# Patient Record
Sex: Male | Born: 1935 | Race: White | Hispanic: No | Marital: Married | Smoking: Never smoker
Health system: Southern US, Community
[De-identification: ages and names within clinical notes are randomized; demographics above are authoritative.]

## PROBLEM LIST (undated history)

## (undated) DIAGNOSIS — M069 Rheumatoid arthritis, unspecified: Secondary | ICD-10-CM

## (undated) DIAGNOSIS — I219 Acute myocardial infarction, unspecified: Secondary | ICD-10-CM

## (undated) DIAGNOSIS — I10 Essential (primary) hypertension: Secondary | ICD-10-CM

## (undated) DIAGNOSIS — E785 Hyperlipidemia, unspecified: Secondary | ICD-10-CM

## (undated) DIAGNOSIS — H6060 Unspecified chronic otitis externa, unspecified ear: Secondary | ICD-10-CM

## (undated) DIAGNOSIS — H9190 Unspecified hearing loss, unspecified ear: Secondary | ICD-10-CM

## (undated) DIAGNOSIS — K219 Gastro-esophageal reflux disease without esophagitis: Secondary | ICD-10-CM

## (undated) DIAGNOSIS — I050 Rheumatic mitral stenosis: Secondary | ICD-10-CM

## (undated) DIAGNOSIS — R51 Headache: Secondary | ICD-10-CM

## (undated) DIAGNOSIS — N400 Enlarged prostate without lower urinary tract symptoms: Secondary | ICD-10-CM

## (undated) DIAGNOSIS — J309 Allergic rhinitis, unspecified: Secondary | ICD-10-CM

## (undated) DIAGNOSIS — Z974 Presence of external hearing-aid: Secondary | ICD-10-CM

## (undated) DIAGNOSIS — H905 Unspecified sensorineural hearing loss: Secondary | ICD-10-CM

## (undated) DIAGNOSIS — M4850XA Collapsed vertebra, not elsewhere classified, site unspecified, initial encounter for fracture: Secondary | ICD-10-CM

## (undated) DIAGNOSIS — I2510 Atherosclerotic heart disease of native coronary artery without angina pectoris: Secondary | ICD-10-CM

## (undated) DIAGNOSIS — R519 Headache, unspecified: Secondary | ICD-10-CM

## (undated) DIAGNOSIS — I4892 Unspecified atrial flutter: Secondary | ICD-10-CM

## (undated) DIAGNOSIS — C61 Malignant neoplasm of prostate: Secondary | ICD-10-CM

## (undated) DIAGNOSIS — A499 Bacterial infection, unspecified: Secondary | ICD-10-CM

## (undated) DIAGNOSIS — I350 Nonrheumatic aortic (valve) stenosis: Secondary | ICD-10-CM

## (undated) DIAGNOSIS — N390 Urinary tract infection, site not specified: Secondary | ICD-10-CM

## (undated) HISTORY — DX: Bacterial infection, unspecified: A49.9

## (undated) HISTORY — PX: CORONARY ARTERY BYPASS GRAFT: SHX141

## (undated) HISTORY — DX: Urinary tract infection, site not specified: N39.0

## (undated) HISTORY — DX: Allergic rhinitis, unspecified: J30.9

## (undated) HISTORY — DX: Collapsed vertebra, not elsewhere classified, site unspecified, initial encounter for fracture: M48.50XA

## (undated) HISTORY — DX: Nonrheumatic aortic (valve) stenosis: I35.0

## (undated) HISTORY — PX: CARDIAC CATHETERIZATION: SHX172

## (undated) HISTORY — DX: Rheumatic mitral stenosis: I05.0

## (undated) HISTORY — DX: Unspecified chronic otitis externa, unspecified ear: H60.60

## (undated) HISTORY — DX: Benign prostatic hyperplasia without lower urinary tract symptoms: N40.0

## (undated) HISTORY — DX: Rheumatoid arthritis, unspecified: M06.9

## (undated) HISTORY — DX: Unspecified sensorineural hearing loss: H90.5

## (undated) HISTORY — PX: PROSTATE SURGERY: SHX751

## (undated) HISTORY — DX: Atherosclerotic heart disease of native coronary artery without angina pectoris: I25.10

## (undated) HISTORY — DX: Hyperlipidemia, unspecified: E78.5

## (undated) HISTORY — DX: Essential (primary) hypertension: I10

## (undated) HISTORY — DX: Unspecified atrial flutter: I48.92

---

## 1989-07-08 HISTORY — PX: CARDIAC SURGERY: SHX584

## 2004-09-25 ENCOUNTER — Ambulatory Visit: Payer: Self-pay | Admitting: Urology

## 2004-10-03 ENCOUNTER — Ambulatory Visit: Payer: Self-pay | Admitting: Urology

## 2006-04-17 ENCOUNTER — Ambulatory Visit: Payer: Self-pay | Admitting: Ophthalmology

## 2006-04-23 ENCOUNTER — Ambulatory Visit: Payer: Self-pay | Admitting: Ophthalmology

## 2006-06-10 ENCOUNTER — Ambulatory Visit: Payer: Self-pay | Admitting: Specialist

## 2006-06-10 ENCOUNTER — Other Ambulatory Visit: Payer: Self-pay

## 2006-06-13 ENCOUNTER — Ambulatory Visit: Payer: Self-pay | Admitting: Specialist

## 2006-12-09 ENCOUNTER — Ambulatory Visit: Payer: Self-pay | Admitting: Ophthalmology

## 2008-01-20 ENCOUNTER — Ambulatory Visit: Payer: Self-pay | Admitting: Urology

## 2008-11-11 ENCOUNTER — Ambulatory Visit: Payer: Self-pay | Admitting: Urology

## 2009-06-27 ENCOUNTER — Ambulatory Visit: Payer: Self-pay | Admitting: Urology

## 2009-07-04 ENCOUNTER — Ambulatory Visit: Payer: Self-pay | Admitting: Urology

## 2010-07-08 HISTORY — PX: A FLUTTER ABLATION: SHX5348

## 2011-11-05 DIAGNOSIS — E785 Hyperlipidemia, unspecified: Secondary | ICD-10-CM

## 2011-11-05 DIAGNOSIS — M4850XA Collapsed vertebra, not elsewhere classified, site unspecified, initial encounter for fracture: Secondary | ICD-10-CM | POA: Insufficient documentation

## 2011-11-05 DIAGNOSIS — N400 Enlarged prostate without lower urinary tract symptoms: Secondary | ICD-10-CM

## 2011-11-05 DIAGNOSIS — M069 Rheumatoid arthritis, unspecified: Secondary | ICD-10-CM

## 2011-11-05 DIAGNOSIS — I10 Essential (primary) hypertension: Secondary | ICD-10-CM | POA: Insufficient documentation

## 2011-11-05 DIAGNOSIS — N390 Urinary tract infection, site not specified: Secondary | ICD-10-CM

## 2011-11-05 DIAGNOSIS — H6060 Unspecified chronic otitis externa, unspecified ear: Secondary | ICD-10-CM

## 2011-11-05 DIAGNOSIS — I350 Nonrheumatic aortic (valve) stenosis: Secondary | ICD-10-CM

## 2011-11-05 DIAGNOSIS — A499 Bacterial infection, unspecified: Secondary | ICD-10-CM

## 2011-11-05 HISTORY — DX: Nonrheumatic aortic (valve) stenosis: I35.0

## 2011-11-05 HISTORY — DX: Rheumatoid arthritis, unspecified: M06.9

## 2011-11-05 HISTORY — DX: Collapsed vertebra, not elsewhere classified, site unspecified, initial encounter for fracture: M48.50XA

## 2011-11-05 HISTORY — DX: Unspecified chronic otitis externa, unspecified ear: H60.60

## 2011-11-05 HISTORY — DX: Hyperlipidemia, unspecified: E78.5

## 2011-11-05 HISTORY — DX: Urinary tract infection, site not specified: N39.0

## 2011-11-05 HISTORY — DX: Benign prostatic hyperplasia without lower urinary tract symptoms: N40.0

## 2011-11-05 HISTORY — DX: Essential (primary) hypertension: I10

## 2011-11-05 HISTORY — DX: Urinary tract infection, site not specified: A49.9

## 2012-12-30 DIAGNOSIS — H905 Unspecified sensorineural hearing loss: Secondary | ICD-10-CM

## 2012-12-30 HISTORY — DX: Unspecified sensorineural hearing loss: H90.5

## 2013-04-16 ENCOUNTER — Ambulatory Visit: Payer: Self-pay | Admitting: Internal Medicine

## 2013-04-16 DIAGNOSIS — I4892 Unspecified atrial flutter: Secondary | ICD-10-CM

## 2013-04-16 HISTORY — DX: Unspecified atrial flutter: I48.92

## 2013-10-20 ENCOUNTER — Ambulatory Visit: Payer: Self-pay | Admitting: Ophthalmology

## 2013-12-28 DIAGNOSIS — I050 Rheumatic mitral stenosis: Secondary | ICD-10-CM

## 2013-12-28 HISTORY — DX: Rheumatic mitral stenosis: I05.0

## 2014-08-04 DIAGNOSIS — I2510 Atherosclerotic heart disease of native coronary artery without angina pectoris: Secondary | ICD-10-CM

## 2014-08-04 DIAGNOSIS — J309 Allergic rhinitis, unspecified: Secondary | ICD-10-CM | POA: Insufficient documentation

## 2014-08-04 HISTORY — DX: Allergic rhinitis, unspecified: J30.9

## 2014-08-04 HISTORY — DX: Atherosclerotic heart disease of native coronary artery without angina pectoris: I25.10

## 2014-09-07 ENCOUNTER — Encounter
Admit: 2014-09-07 | Disposition: A | Discharge status: Home / Self Care | Payer: Self-pay | Attending: Rheumatology | Admitting: Rheumatology

## 2014-10-07 ENCOUNTER — Encounter
Admit: 2014-10-07 | Disposition: A | Discharge status: Home / Self Care | Payer: Self-pay | Attending: Rheumatology | Admitting: Rheumatology

## 2015-04-04 ENCOUNTER — Encounter: Payer: Self-pay | Admitting: *Deleted

## 2015-04-06 ENCOUNTER — Ambulatory Visit (INDEPENDENT_AMBULATORY_CARE_PROVIDER_SITE_OTHER): Payer: Medicare Other | Admitting: Obstetrics and Gynecology

## 2015-04-06 ENCOUNTER — Telehealth: Payer: Self-pay | Admitting: Obstetrics and Gynecology

## 2015-04-06 ENCOUNTER — Encounter: Payer: Self-pay | Admitting: Obstetrics and Gynecology

## 2015-04-06 VITALS — BP 136/55 | HR 59 | Resp 16 | Ht 66.0 in | Wt 177.2 lb

## 2015-04-06 DIAGNOSIS — C61 Malignant neoplasm of prostate: Secondary | ICD-10-CM | POA: Diagnosis not present

## 2015-04-06 DIAGNOSIS — R339 Retention of urine, unspecified: Secondary | ICD-10-CM

## 2015-04-06 LAB — BLADDER SCAN AMB NON-IMAGING

## 2015-04-06 MED ORDER — FINASTERIDE 5 MG PO TABS: 5.0000 mg | ORAL_TABLET | Freq: Every day | ORAL | Status: DC

## 2015-04-06 MED ORDER — TAMSULOSIN HCL 0.4 MG PO CAPS: 0.4000 mg | ORAL_CAPSULE | Freq: Every day | ORAL | Status: DC

## 2015-04-06 NOTE — Telephone Encounter (Signed)
Spoke with pt in reference to medication. Medication was sent to pharmacy and pt made aware. Nurse also made pt aware PSA will the drawn in 2 weeks when he returns for a urine drop off. Pt voiced understanding.

## 2015-04-06 NOTE — Telephone Encounter (Signed)
The patient left prior to having his PSA drawn today. He has an appointment in 2 weeks to drop off a urine for culture please notify him that he needs to have his PSA drawn on that day. I put in the order and I changed it on the lab appointment already. Thank you

## 2015-04-06 NOTE — Progress Notes (Signed)
04/06/2015 12:20 PM   Richard Elliott 1924-07-16 [id number]  Referring provider: No referring provider defined for this encounter.  Chief Complaint  Patient presents with  . Urinary Tract Infection  . Urinary Retention  . Establish Care    HPI: Patient is an 81 year old man with a history of prostate cancer status post cryoablation by Dr. Ernst Spell in 2010. His last PSA on 09/15/12 was 2.1. He presents today with complaints of difficulty urinating and urinary tract infection.  He was seen at [facility] for mild dysuria, urgency and frequency. 03/27/15 UA >50WBCs, moderate bacteria, 0-3 RBCs.  He was startedon Ceftin.  No urine culture available.  I-PSS 14 QOL 2  09/18/13 UC +E. Coli  PMH: Past Medical History  Diagnosis Date  . Allergic rhinitis 08/04/2014  . Aortic heart valve narrowing 11/05/2011    Overview:  a.  03/2010:  moderate.  33 mmHg peak grad 24 mmHg mean grad 1.3 cm2    . Atrial flutter 04/16/2013    Overview:  10/14 hosp, felt to be secondary to choking episode.  TEE and DC CV.   Marland Kitchen Benign fibroma of prostate 11/05/2011    Overview:  a. Status post prostatic biopsy x 2 which have been negative.   Marland Kitchen CAD in native artery 08/04/2014    Overview:  a. Status post PCI of RCA x 2.  b. 4/00 Positive Myoview. Catheterization revealed EF 70% with 3-vessel disease.  c. 11/01/98 CABG x 3 (by Dr. Ronnald Ramp), LIMA to LAD, SVG to RCA, SVG to ramus.  d. 7/00 catheterization, 100% LIMA, patent vein grafts.  e. 7/00 & 9/00 PCI of LAD.  f. 1/01 Redo CABG (by Dr. Corinna Capra), RIMA to distal LAD, SVG to D1, EF 65% at time of cardiac catheterization.     . Chronic otitis externa 11/05/2011  . Compressed spine fracture 11/05/2011    Overview:  11/97 Lumbar collapsed vertebrae with impingement.    . Deafness, sensorineural 12/30/2012  . HLD (hyperlipidemia) 11/05/2011    Overview:  a. 2/07 Lipid profile:  TC 239, TG 232, HDL 35, LDL 158.  b. Many intolerances to Lipid medications.   . BP (high blood pressure)  11/05/2011  . Mitral stenosis 12/28/2013    Overview:  Moderate per ECHO 10/14 with mean grad 4 and peak grad 24mmHg ALSO mention of mid LV gradient of 67mmHG   . Arthritis or polyarthritis, rheumatoid 11/05/2011  . Bacterial urinary infection 11/05/2011    Overview:  Urinary tract infections/prostatitis.  Followed by a urologist in [place], [place].     Surgical History: Past Surgical History  Procedure Laterality Date  . Prostate surgery    . Cardiac surgery  1991    Home Medications:    Medication List       This list is accurate as of: 04/06/15 12:20 PM.  Always use your most recent med list.               aspirin 81 MG tablet  Take by mouth.     cefUROXime 250 MG tablet  Commonly known as:  CEFTIN  Take 250 mg by mouth.     cetirizine 10 MG tablet  Commonly known as:  ZYRTEC  Take by mouth.     CRANBERRY PLUS VITAMIN C 4200-20-3 MG-MG-UNIT Caps  Generic drug:  Cranberry-Vitamin C-Vitamin E  Take by mouth.     diphenhydrAMINE 25 mg capsule  Commonly known as:  BENADRYL  Take by mouth.     ELIQUIS 2.5 MG  Tabs tablet  Generic drug:  apixaban     Fish Oil 1200 MG Caps  Take by mouth.     fluticasone 50 MCG/ACT nasal spray  Commonly known as:  FLONASE  Place into the nose.     lansoprazole 15 MG capsule  Commonly known as:  PREVACID  Take by mouth.     losartan 50 MG tablet  Commonly known as:  COZAAR     magnesium chloride 64 MG Tbec SR tablet  Commonly known as:  SLOW-MAG  Take by mouth.     metoprolol tartrate 25 MG tablet  Commonly known as:  LOPRESSOR     montelukast 10 MG tablet  Commonly known as:  SINGULAIR     nitroGLYCERIN 0.4 MG SL tablet  Commonly known as:  NITROSTAT  Place 1 tablet under tongue as needed for chest pain (may repeat every 5 minutes but seek medical help if pain persists after 3 tablets) as needed     senna-docusate 8.6-50 MG tablet  Commonly known as:  Senokot-S  Take by mouth.        Allergies:  Allergies  Allergen  Reactions  . Eggs Or Egg-Derived Products Anaphylaxis  . Lisinopril     Other reaction(s): Cough  . Atorvastatin     Other reaction(s): Other (See Comments) Pain to walk, stiffness in joints  . Fenofibrate     Other reaction(s): Other (See Comments) Leg Pain  . Niacin     Other reaction(s): Other (See Comments) Hot flashes  . Simvastatin     Other reaction(s): Other (See Comments) Pain to walk and joint stiffness in legs  . Soy Allergy Other (See Comments)  . Wheat Bran     Other reaction(s): Unknown  . Aspirin Nausea Only    Other Reaction: GI Upset  . Nitrofuran Derivatives     Other reaction(s): Other (See Comments) Other Reaction: GI UPSET    Family History: Family History  Problem Relation Age of Onset  . Leukemia Father   . Heart failure Mother   . Hypertension Paternal Grandmother   . Heart attack Maternal Grandfather     Social History:  reports that he has never smoked. He does not have any smokeless tobacco history on file. He reports that he drinks alcohol. His drug history is not on file.  ROS: UROLOGY Frequent Urination?: No Hard to postpone urination?: Yes Burning/pain with urination?: No Get up at night to urinate?: No Leakage of urine?: Yes Urine stream starts and stops?: Yes Trouble starting stream?: Yes Do you have to strain to urinate?: No Blood in urine?: No Urinary tract infection?: Yes Sexually transmitted disease?: No Injury to kidneys or bladder?: No Painful intercourse?: No Weak stream?: Yes Erection problems?: Yes Penile pain?: No  Gastrointestinal Nausea?: No Vomiting?: No Indigestion/heartburn?: Yes Diarrhea?: Yes Constipation?: No  Constitutional Fever: No Night sweats?: Yes Weight loss?: No Fatigue?: Yes  Skin Skin rash/lesions?: No Itching?: No  Eyes Blurred vision?: No Double vision?: No  Ears/Nose/Throat Sore throat?: No Sinus problems?: Yes  Hematologic/Lymphatic Swollen glands?: No Easy bruising?:  No  Cardiovascular Leg swelling?: No Chest pain?: No  Respiratory Cough?: Yes Shortness of breath?: No  Endocrine Excessive thirst?: No  Musculoskeletal Back pain?: No Joint pain?: No  Neurological Headaches?: Yes Dizziness?: No  Psychologic Depression?: No Anxiety?: No  Physical Exam: BP 136/55 mmHg  Pulse 59  Resp 16  Ht 5\' 6"  (1.676 m)  Wt 177 lb 3.2 oz (80.377 kg)  BMI 28.61 kg/m2  Constitutional:  Alert and oriented, No acute distress. HEENT: [place] AT, moist mucus membranes.  Trachea midline, no masses. Cardiovascular: No clubbing, cyanosis, or edema. Respiratory: Normal respiratory effort, no increased work of breathing. GI: Abdomen is soft, nontender, nondistended, no abdominal masses GU: No CVA tenderness, testicles descended bilaterally without masses or tenderness, normal phallus DRE: small, hard asymmetrical prostate R>L Skin: No rashes, bruises or suspicious lesions. Lymph: No cervical or inguinal adenopathy. Neurologic: Grossly intact, no focal deficits, moving all 4 extremities. Psychiatric: Normal mood and affect.  Laboratory Data: No results found for: WBC, HGB, HCT, MCV, PLT  No results found for: CREATININE  No results found for: PSA  No results found for: TESTOSTERONE  No results found for: HGBA1C  Urinalysis No results found for: COLORURINE, APPEARANCEUR, LABSPEC, PHURINE, GLUCOSEU, HGBUR, BILIRUBINUR, KETONESUR, PROTEINUR, UROBILINOGEN, NITRITE, LEUKOCYTESUR  Pertinent Imaging:   Assessment & Plan:    1. Urinary retention- PVR 92 mL's today. I believe patient's symptoms of difficulty voiding related to his recent urinary tract infection. I will have him come back in 2 weeks after completing Ceftin for repeat urine culture. Discussed patient's case with Dr. Ivory Broad. We will try combination of Flomax and finasteride and see if patient's symptoms improve.  If there is no improvement with medical management patient may need outlet procedure  in the future. - Urinalysis, Complete - BLADDER SCAN AMB NON-IMAGING  2. Prostate Cancer- S/p cryoablation by Dr. Madelin Headings 2010.  Last PSA 2014 2.1.PSA nadir 0.3? We'll recheck PSA when patient comes back in 2 weeks to provide urine specimen.  Return in about 3 months (around 07/06/2015) for 2 weeks lab visit drop off urine for culture;recheck urinary retention/PVR.  Richard Elliott, [facility] [address], [place], [place] [address] [phone]

## 2015-04-06 NOTE — Telephone Encounter (Signed)
Pt called and left message on voice mail.  Was seen this morning, checked with his pharmacy and Rx has not been called in yet.  Patient is asking that Rx be called in.  Please call the patient when this has been done [phone]

## 2015-04-10 ENCOUNTER — Telehealth: Payer: Self-pay | Admitting: Radiology

## 2015-04-10 LAB — URINALYSIS, COMPLETE
BILIRUBIN UA: NEGATIVE
Glucose, UA: NEGATIVE
KETONES UA: NEGATIVE
LEUKOCYTES UA: NEGATIVE
Nitrite, UA: NEGATIVE
PROTEIN UA: NEGATIVE
RBC UA: NEGATIVE
SPEC GRAV UA: 1.02 (ref 1.005–1.030)
Urobilinogen, Ur: 0.2 mg/dL (ref 0.2–1.0)
pH, UA: 5.5 (ref 5.0–7.5)

## 2015-04-10 LAB — MICROSCOPIC EXAMINATION: Renal Epithel, UA: NONE SEEN /hpf

## 2015-04-10 NOTE — Telephone Encounter (Signed)
Spoke with patient and he states the message is old (but I just received it) he states he did get the med from [facility] and has been taking it for three days. Clarified next appt. for patient.

## 2015-04-10 NOTE — Telephone Encounter (Signed)
Pt states a prescription was to be sent to the [facility] in [place] but they haven't received it.  Please return pt's call at [phone].

## 2015-04-20 ENCOUNTER — Other Ambulatory Visit: Payer: Medicare Other

## 2015-04-20 DIAGNOSIS — N390 Urinary tract infection, site not specified: Secondary | ICD-10-CM

## 2015-04-20 DIAGNOSIS — C61 Malignant neoplasm of prostate: Secondary | ICD-10-CM

## 2015-04-21 LAB — PSA: PROSTATE SPECIFIC AG, SERUM: 0.5 ng/mL (ref 0.0–4.0)

## 2015-04-22 LAB — CULTURE, URINE COMPREHENSIVE

## 2015-04-24 ENCOUNTER — Telehealth: Payer: Self-pay

## 2015-04-24 DIAGNOSIS — R972 Elevated prostate specific antigen [PSA]: Secondary | ICD-10-CM

## 2015-04-24 NOTE — Telephone Encounter (Signed)
-----   Message from Roda Shutters, [place] sent at 04/24/2015  8:39 AM EDT ----- Please notify patient that his urine culture was negative for infection. His PSA came back at 0.5 which is lower than his previous. I would like to recheck his PSA in 6 months. Please put in a lab order for this and schedule a lab draw appointment. Thanks

## 2015-04-24 NOTE — Telephone Encounter (Signed)
Per pt request, letter sent making aware to keep Dec appt. And lab appt made and sent via mail.

## 2015-04-24 NOTE — Telephone Encounter (Signed)
Spoke with pt in reference to -ucx and PSA. Pt stated he has an appt 06/26/15, does he need to keep this appt or change to 82mo? Please advise.

## 2015-04-24 NOTE — Telephone Encounter (Signed)
He needs to keep his appt as scheduled and come in for a lab appt in 6 months to draw his PSA.

## 2015-07-06 ENCOUNTER — Ambulatory Visit (INDEPENDENT_AMBULATORY_CARE_PROVIDER_SITE_OTHER): Payer: Medicare Other | Admitting: Obstetrics and Gynecology

## 2015-07-06 ENCOUNTER — Encounter: Payer: Self-pay | Admitting: Obstetrics and Gynecology

## 2015-07-06 VITALS — Resp 16 | Ht 66.0 in | Wt 174.7 lb

## 2015-07-06 DIAGNOSIS — R339 Retention of urine, unspecified: Secondary | ICD-10-CM | POA: Diagnosis not present

## 2015-07-06 DIAGNOSIS — C61 Malignant neoplasm of prostate: Secondary | ICD-10-CM | POA: Diagnosis not present

## 2015-07-06 LAB — MICROSCOPIC EXAMINATION
Bacteria, UA: NONE SEEN
RBC MICROSCOPIC, UA: NONE SEEN /HPF (ref 0–?)

## 2015-07-06 LAB — URINALYSIS, COMPLETE
BILIRUBIN UA: NEGATIVE
GLUCOSE, UA: NEGATIVE
KETONES UA: NEGATIVE
LEUKOCYTES UA: NEGATIVE
NITRITE UA: NEGATIVE
Protein, UA: NEGATIVE
RBC UA: NEGATIVE
SPEC GRAV UA: 1.015 (ref 1.005–1.030)
Urobilinogen, Ur: 0.2 mg/dL (ref 0.2–1.0)
pH, UA: 6 (ref 5.0–7.5)

## 2015-07-06 LAB — BLADDER SCAN AMB NON-IMAGING: Scan Result: 52

## 2015-07-06 MED ORDER — FINASTERIDE 5 MG PO TABS: 5.0000 mg | ORAL_TABLET | Freq: Every day | ORAL | Status: DC

## 2015-07-06 MED ORDER — TAMSULOSIN HCL 0.4 MG PO CAPS: 0.4000 mg | ORAL_CAPSULE | Freq: Every day | ORAL | Status: DC

## 2015-07-06 NOTE — Progress Notes (Signed)
11:45 AM   TANMAY NIGRELLI 1925/03/14 [id number]  Referring provider: Ezequiel Kayser, MD [facility] [place], [place] [address]  Chief Complaint  Patient presents with  . Urinary Retention    HPI: Patient is an 81 year old man with a history of prostate cancer status post cryoablation by Dr. Ernst Spell in 2010. His last PSA on 09/15/12 was 2.1. He presents today with complaints of difficulty urinating and urinary tract infection.  He was seen at [facility] for mild dysuria, urgency and frequency. 03/27/15 UA >50WBCs, moderate bacteria, 0-3 RBCs.  He was startedon Ceftin.  No urine culture available.  I-PSS 14 QOL 2  09/18/13 UC +E. Coli  Current Status: Patient presents today for recheck on urinary retention and urinary tract infection. He reports overall he has been feeling well though his wife passed away recently. His urinary symptoms are much improved. He reports a good urinary flow, minimal hesitancy, no sensation of incomplete bladder emptying. He does state that he experiences some occasional leakage including post void dribbling. He states that he previously was wearing tight fitting underwear and has since switched to boxer briefs which he believes has also significantly improved his urinary leakage. He was started on Flomax and finasteride at his last visit he has been doing well on medications without any adverse reactions.   PMH: Past Medical History  Diagnosis Date  . Allergic rhinitis 08/04/2014  . Aortic heart valve narrowing 11/05/2011    Overview:  a.  03/2010:  moderate.  33 mmHg peak grad 24 mmHg mean grad 1.3 cm2    . Atrial flutter 04/16/2013    Overview:  10/14 hosp, felt to be secondary to choking episode.  TEE and DC CV.   Marland Kitchen Benign fibroma of prostate 11/05/2011    Overview:  a. Status post prostatic biopsy x 2 which have been negative.   Marland Kitchen CAD in native artery 08/04/2014    Overview:  a. Status post PCI of RCA x 2.  b. 4/00 Positive Myoview.  Catheterization revealed EF 70% with 3-vessel disease.  c. 11/01/98 CABG x 3 (by Dr. Ronnald Ramp), LIMA to LAD, SVG to RCA, SVG to ramus.  d. 7/00 catheterization, 100% LIMA, patent vein grafts.  e. 7/00 & 9/00 PCI of LAD.  f. 1/01 Redo CABG (by Dr. Corinna Capra), RIMA to distal LAD, SVG to D1, EF 65% at time of cardiac catheterization.     . Chronic otitis externa 11/05/2011  . Compressed spine fracture 11/05/2011    Overview:  11/97 Lumbar collapsed vertebrae with impingement.    . Deafness, sensorineural 12/30/2012  . HLD (hyperlipidemia) 11/05/2011    Overview:  a. 2/07 Lipid profile:  TC 239, TG 232, HDL 35, LDL 158.  b. Many intolerances to Lipid medications.   . BP (high blood pressure) 11/05/2011  . Mitral stenosis 12/28/2013    Overview:  Moderate per ECHO 10/14 with mean grad 4 and peak grad 57mmHg ALSO mention of mid LV gradient of 70mmHG   . Arthritis or polyarthritis, rheumatoid 11/05/2011  . Bacterial urinary infection 11/05/2011    Overview:  Urinary tract infections/prostatitis.  Followed by a urologist in [place], [place].     Surgical History: Past Surgical History  Procedure Laterality Date  . Prostate surgery    . Cardiac surgery  1991    Home Medications:    Medication List       This list is accurate as of: 07/06/15 11:45 AM.  Always use your most recent  med list.               aspirin 81 MG tablet  Take by mouth.     cetirizine 10 MG tablet  Commonly known as:  ZYRTEC  Take by mouth.     CRANBERRY PLUS VITAMIN C 4200-20-3 MG-MG-UNIT Caps  Generic drug:  Cranberry-Vitamin C-Vitamin E  Take by mouth.     diphenhydrAMINE 25 mg capsule  Commonly known as:  BENADRYL  Take by mouth.     ELIQUIS 2.5 MG Tabs tablet  Generic drug:  apixaban     finasteride 5 MG tablet  Commonly known as:  PROSCAR  Take 1 tablet (5 mg total) by mouth daily.     Fish Oil 1200 MG Caps  Take by mouth.     fluticasone 50 MCG/ACT nasal spray  Commonly known as:  FLONASE  Place into the nose.       lansoprazole 15 MG capsule  Commonly known as:  PREVACID  Take by mouth.     losartan 50 MG tablet  Commonly known as:  COZAAR     magnesium chloride 64 MG Tbec SR tablet  Commonly known as:  SLOW-MAG  Take by mouth.     metoprolol tartrate 25 MG tablet  Commonly known as:  LOPRESSOR     montelukast 10 MG tablet  Commonly known as:  SINGULAIR     nitroGLYCERIN 0.4 MG SL tablet  Commonly known as:  NITROSTAT  Place 1 tablet under tongue as needed for chest pain (may repeat every 5 minutes but seek medical help if pain persists after 3 tablets) as needed     senna-docusate 8.6-50 MG tablet  Commonly known as:  Senokot-S  Take by mouth.     SM GLUCOSAMINE HCL 1500 MG Tabs  Generic drug:  Glucosamine HCl  Take by mouth.     tamsulosin 0.4 MG Caps capsule  Commonly known as:  FLOMAX  Take 1 capsule (0.4 mg total) by mouth daily.     VITAMIN B COMPLEX-C Caps  Take by mouth.        Allergies:  Allergies  Allergen Reactions  . Eggs Or Egg-Derived Products Anaphylaxis  . Lisinopril     Other reaction(s): Cough  . Atorvastatin     Other reaction(s): Other (See Comments) Pain to walk, stiffness in joints  . Fenofibrate     Other reaction(s): Other (See Comments) Leg Pain  . Niacin     Other reaction(s): Other (See Comments) Hot flashes  . Simvastatin     Other reaction(s): Other (See Comments) Pain to walk and joint stiffness in legs  . Soy Allergy Other (See Comments)  . Wheat Bran     Other reaction(s): Unknown  . Aspirin Nausea Only    Other Reaction: GI Upset  . Nitrofuran Derivatives     Other reaction(s): Other (See Comments) Other Reaction: GI UPSET    Family History: Family History  Problem Relation Age of Onset  . Leukemia Father   . Heart failure Mother   . Hypertension Paternal Grandmother   . Heart attack Maternal Grandfather     Social History:  reports that he has never smoked. He does not have any smokeless tobacco history on file. He  reports that he drinks alcohol. His drug history is not on file.  ROS: UROLOGY Frequent Urination?: No Hard to postpone urination?: No Burning/pain with urination?: No Get up at night to urinate?: No Leakage of urine?: No Urine stream starts  and stops?: No Trouble starting stream?: No Do you have to strain to urinate?: No Blood in urine?: No Urinary tract infection?: No Sexually transmitted disease?: No Injury to kidneys or bladder?: No Painful intercourse?: No Weak stream?: No Erection problems?: No Penile pain?: No  Gastrointestinal Nausea?: No Vomiting?: No Indigestion/heartburn?: No Diarrhea?: No Constipation?: No  Constitutional Fever: No Night sweats?: No Weight loss?: No Fatigue?: No  Skin Skin rash/lesions?: No Itching?: No  Eyes Blurred vision?: No Double vision?: No  Ears/Nose/Throat Sore throat?: No Sinus problems?: No  Hematologic/Lymphatic Swollen glands?: No Easy bruising?: No  Cardiovascular Leg swelling?: No Chest pain?: No  Respiratory Cough?: No Shortness of breath?: No  Endocrine Excessive thirst?: No  Musculoskeletal Back pain?: No Joint pain?: No  Neurological Headaches?: No Dizziness?: No  Psychologic Depression?: No Anxiety?: No  Physical Exam: Resp 16  Ht 5\' 6"  (1.676 m)  Wt 174 lb 11.2 oz (79.243 kg)  BMI 28.21 kg/m2  Constitutional:  Alert and oriented, No acute distress. HEENT: [place] AT, moist mucus membranes.  Trachea midline, no masses. Cardiovascular: No clubbing, cyanosis, or edema. Respiratory: Normal respiratory effort, no increased work of breathing. Skin: No rashes, bruises or suspicious lesions. Lymph: No cervical or inguinal adenopathy. Neurologic: Grossly intact, no focal deficits, moving all 4 extremities. Psychiatric: Normal mood and affect.  Laboratory Data: No results found for: WBC, HGB, HCT, MCV, PLT  No results found for: CREATININE  Lab Results  Component Value Date   PSA 0.5  04/20/2015    No results found for: TESTOSTERONE  No results found for: HGBA1C  Urinalysis Results for orders placed or performed in visit on 07/06/15  Microscopic Examination  Result Value Ref Range   WBC, UA 0-5 0 -  5 /hpf   RBC, UA None seen 0 -  2 /hpf   Epithelial Cells (non renal) 0-10 0 - 10 /hpf   Mucus, UA Present (A) Not Estab.   Bacteria, UA None seen None seen/Few  Urinalysis, Complete  Result Value Ref Range   Specific Gravity, UA 1.015 1.005 - 1.030   pH, UA 6.0 5.0 - 7.5   Color, UA Yellow Yellow   Appearance Ur Clear Clear   Leukocytes, UA Negative Negative   Protein, UA Negative Negative/Trace   Glucose, UA Negative Negative   Ketones, UA Negative Negative   RBC, UA Negative Negative   Bilirubin, UA Negative Negative   Urobilinogen, Ur 0.2 0.2 - 1.0 mg/dL   Nitrite, UA Negative Negative   Microscopic Examination See below:   BLADDER SCAN AMB NON-IMAGING  Result Value Ref Range   Scan Result 52      Pertinent Imaging:   Assessment & Plan:    1. Urinary retention- Patient reports no further episodes of urinary retention. He reports his symptoms have significantly proved on finasteride and Flomax. He is tolerating the medications well and is satisfied with symptom management. We will refill his prescriptions today. - Urinalysis, Complete - BLADDER SCAN AMB NON-IMAGING  2. Prostate Cancer- S/p cryoablation by Dr. Madelin Headings 2010.  Last PSA 2014 2.1.PSA nadir 0.3? 04/20/15 PSA 0.5.  Recheck in 4 months and review and follow up visit.  Return in about 6 months (around 01/04/2016) for urinary retention; UTI.  Herbert Moors, [facility] [address] [place], [place] [address] [phone]

## 2015-07-14 ENCOUNTER — Other Ambulatory Visit: Payer: Self-pay

## 2015-07-14 DIAGNOSIS — N400 Enlarged prostate without lower urinary tract symptoms: Secondary | ICD-10-CM

## 2015-07-14 MED ORDER — FINASTERIDE 5 MG PO TABS: 5.0000 mg | ORAL_TABLET | Freq: Every day | ORAL | Status: DC

## 2015-07-14 MED ORDER — TAMSULOSIN HCL 0.4 MG PO CAPS: 0.4000 mg | ORAL_CAPSULE | Freq: Every day | ORAL | Status: DC

## 2015-10-23 ENCOUNTER — Other Ambulatory Visit: Payer: Medicare HMO

## 2015-10-23 DIAGNOSIS — R972 Elevated prostate specific antigen [PSA]: Secondary | ICD-10-CM

## 2015-10-24 ENCOUNTER — Telehealth: Payer: Self-pay

## 2015-10-24 LAB — PSA: Prostate Specific Ag, Serum: 0.1 ng/mL (ref 0.0–4.0)

## 2015-10-24 NOTE — Telephone Encounter (Signed)
-----   Message from Nori Riis, PA-C sent at 10/24/2015  8:35 AM EDT ----- PSA has reduced in value.  We will see him on 01/04/2016 at 10:00 am.

## 2015-10-24 NOTE — Telephone Encounter (Signed)
Spoke with pt in reference PSA results. Pt voiced understanding.  

## 2015-11-12 ENCOUNTER — Ambulatory Visit (INDEPENDENT_AMBULATORY_CARE_PROVIDER_SITE_OTHER): Payer: Medicare HMO

## 2015-11-12 ENCOUNTER — Ambulatory Visit
Admission: EM | Admit: 2015-11-12 | Discharge: 2015-11-12 | Disposition: A | Discharge status: Home / Self Care | Payer: Medicare HMO | Attending: Family Medicine | Admitting: Family Medicine

## 2015-11-12 DIAGNOSIS — J40 Bronchitis, not specified as acute or chronic: Secondary | ICD-10-CM | POA: Diagnosis not present

## 2015-11-12 MED ORDER — ALBUTEROL SULFATE HFA 108 (90 BASE) MCG/ACT IN AERS: 1.0000 | INHALATION_SPRAY | Freq: Four times a day (QID) | RESPIRATORY_TRACT | Status: DC | PRN

## 2015-11-12 MED ORDER — AZITHROMYCIN 250 MG PO TABS: ORAL_TABLET | ORAL | Status: DC

## 2015-11-12 NOTE — Discharge Instructions (Signed)
How to Use an Inhaler Proper inhaler technique is very important. Good technique ensures that the medicine reaches the lungs. Poor technique results in depositing the medicine on the tongue and back of the throat rather than in the airways. If you do not use the inhaler with good technique, the medicine will not help you. STEPS TO FOLLOW IF USING AN INHALER WITHOUT AN EXTENSION TUBE  Remove the cap from the inhaler.  If you are using the inhaler for the first time, you will need to prime it. Shake the inhaler for 5 seconds and release four puffs into the air, away from your face. Ask your health care provider or pharmacist if you have questions about priming your inhaler.  Shake the inhaler for 5 seconds before each breath in (inhalation).  Position the inhaler so that the top of the canister faces up.  Put your index finger on the top of the medicine canister. Your thumb supports the bottom of the inhaler.  Open your mouth.  Either place the inhaler between your teeth and place your lips tightly around the mouthpiece, or hold the inhaler 1-2 inches away from your open mouth. If you are unsure of which technique to use, ask your health care provider.  Breathe out (exhale) normally and as completely as possible.  Press the canister down with your index finger to release the medicine.  At the same time as the canister is pressed, inhale deeply and slowly until your lungs are completely filled. This should take 4-6 seconds. Keep your tongue down.  Hold the medicine in your lungs for 5-10 seconds (10 seconds is best). This helps the medicine get into the small airways of your lungs.  Breathe out slowly, through pursed lips. Whistling is an example of pursed lips.  Wait at least 15-30 seconds between puffs. Continue with the above steps until you have taken the number of puffs your health care provider has ordered. Do not use the inhaler more than your health care provider tells  you.  Replace the cap on the inhaler.  Follow the directions from your health care provider or the inhaler insert for cleaning the inhaler. STEPS TO FOLLOW IF USING AN INHALER WITH AN EXTENSION (SPACER)  Remove the cap from the inhaler.  If you are using the inhaler for the first time, you will need to prime it. Shake the inhaler for 5 seconds and release four puffs into the air, away from your face. Ask your health care provider or pharmacist if you have questions about priming your inhaler.  Shake the inhaler for 5 seconds before each breath in (inhalation).  Place the open end of the spacer onto the mouthpiece of the inhaler.  Position the inhaler so that the top of the canister faces up and the spacer mouthpiece faces you.  Put your index finger on the top of the medicine canister. Your thumb supports the bottom of the inhaler and the spacer.  Breathe out (exhale) normally and as completely as possible.  Immediately after exhaling, place the spacer between your teeth and into your mouth. Close your lips tightly around the spacer.  Press the canister down with your index finger to release the medicine.  At the same time as the canister is pressed, inhale deeply and slowly until your lungs are completely filled. This should take 4-6 seconds. Keep your tongue down and out of the way.  Hold the medicine in your lungs for 5-10 seconds (10 seconds is best). This helps the  medicine get into the small airways of your lungs. Exhale.  Repeat inhaling deeply through the spacer mouthpiece. Again hold that breath for up to 10 seconds (10 seconds is best). Exhale slowly. If it is difficult to take this second deep breath through the spacer, breathe normally several times through the spacer. Remove the spacer from your mouth.  Wait at least 15-30 seconds between puffs. Continue with the above steps until you have taken the number of puffs your health care provider has ordered. Do not use the  inhaler more than your health care provider tells you.  Remove the spacer from the inhaler, and place the cap on the inhaler.  Follow the directions from your health care provider or the inhaler insert for cleaning the inhaler and spacer. If you are using different kinds of inhalers, use your quick relief medicine to open the airways 10-15 minutes before using a steroid if instructed to do so by your health care provider. If you are unsure which inhalers to use and the order of using them, ask your health care provider, nurse, or respiratory therapist. If you are using a steroid inhaler, always rinse your mouth with water after your last puff, then gargle and spit out the water. Do not swallow the water. AVOID:  Inhaling before or after starting the spray of medicine. It takes practice to coordinate your breathing with triggering the spray.  Inhaling through the nose (rather than the mouth) when triggering the spray. HOW TO DETERMINE IF YOUR INHALER IS FULL OR NEARLY EMPTY You cannot know when an inhaler is empty by shaking it. A few inhalers are now being made with dose counters. Ask your health care provider for a prescription that has a dose counter if you feel you need that extra help. If your inhaler does not have a counter, ask your health care provider to help you determine the date you need to refill your inhaler. Write the refill date on a calendar or your inhaler canister. Refill your inhaler 7-10 days before it runs out. Be sure to keep an adequate supply of medicine. This includes making sure it is not expired, and that you have a spare inhaler.  SEEK MEDICAL CARE IF:   Your symptoms are only partially relieved with your inhaler.  You are having trouble using your inhaler.  You have some increase in phlegm. SEEK IMMEDIATE MEDICAL CARE IF:   You feel little or no relief with your inhalers. You are still wheezing and are feeling shortness of breath or tightness in your chest or  both.  You have dizziness, headaches, or a fast heart rate.  You have chills, fever, or night sweats.  You have a noticeable increase in phlegm production, or there is blood in the phlegm. MAKE SURE YOU:   Understand these instructions.  Will watch your condition.  Will get help right away if you are not doing well or get worse.   This information is not intended to replace advice given to you by your health care provider. Make sure you discuss any questions you have with your health care provider.   Document Released: 06/21/2000 Document Revised: 04/14/2013 Document Reviewed: 01/21/2013 Elsevier Interactive Patient Education 2016 [employer]  Upper Respiratory Infection, Adult Most upper respiratory infections (URIs) are a viral infection of the air passages leading to the lungs. A URI affects the nose, throat, and upper air passages. The most common type of URI is nasopharyngitis and is typically referred to as "the common cold."  URIs run their course and usually go away on their own. Most of the time, a URI does not require medical attention, but sometimes a bacterial infection in the upper airways can follow a viral infection. This is called a secondary infection. Sinus and middle ear infections are common types of secondary upper respiratory infections. Bacterial pneumonia can also complicate a URI. A URI can worsen asthma and chronic obstructive pulmonary disease (COPD). Sometimes, these complications can require emergency medical care and may be life threatening.  CAUSES Almost all URIs are caused by viruses. A virus is a type of germ and can spread from one person to another.  RISKS FACTORS You may be at risk for a URI if:   You smoke.   You have chronic heart or lung disease.  You have a weakened defense (immune) system.   You are very young or very old.   You have nasal allergies or asthma.  You work in crowded or poorly ventilated areas.  You work in health  care facilities or schools. SIGNS AND SYMPTOMS  Symptoms typically develop 2-3 days after you come in contact with a cold virus. Most viral URIs last 7-10 days. However, viral URIs from the influenza virus (flu virus) can last 14-18 days and are typically more severe. Symptoms may include:   Runny or stuffy (congested) nose.   Sneezing.   Cough.   Sore throat.   Headache.   Fatigue.   Fever.   Loss of appetite.   Pain in your forehead, behind your eyes, and over your cheekbones (sinus pain).  Muscle aches.  DIAGNOSIS  Your health care provider may diagnose a URI by:  Physical exam.  Tests to check that your symptoms are not due to another condition such as:  Strep throat.  Sinusitis.  Pneumonia.  Asthma. TREATMENT  A URI goes away on its own with time. It cannot be cured with medicines, but medicines may be prescribed or recommended to relieve symptoms. Medicines may help:  Reduce your fever.  Reduce your cough.  Relieve nasal congestion. HOME CARE INSTRUCTIONS   Take medicines only as directed by your health care provider.   Gargle warm saltwater or take cough drops to comfort your throat as directed by your health care provider.  Use a warm mist humidifier or inhale steam from a shower to increase air moisture. This may make it easier to breathe.  Drink enough fluid to keep your urine clear or pale yellow.   Eat soups and other clear broths and maintain good nutrition.   Rest as needed.   Return to work when your temperature has returned to normal or as your health care provider advises. You may need to stay home longer to avoid infecting others. You can also use a face mask and careful hand washing to prevent spread of the virus.  Increase the usage of your inhaler if you have asthma.   Do not use any tobacco products, including cigarettes, chewing tobacco, or electronic cigarettes. If you need help quitting, ask your health care  provider. PREVENTION  The best way to protect yourself from getting a cold is to practice good hygiene.   Avoid oral or hand contact with people with cold symptoms.   Wash your hands often if contact occurs.  There is no clear evidence that vitamin C, vitamin E, echinacea, or exercise reduces the chance of developing a cold. However, it is always recommended to get plenty of rest, exercise, and practice good nutrition.  SEEK MEDICAL CARE IF:  °· You are getting worse rather than better.   °· Your symptoms are not controlled by medicine.   °· You have chills. °· You have worsening shortness of breath. °· You have brown or red mucus. °· You have yellow or brown nasal discharge. °· You have pain in your face, especially when you bend forward. °· You have a fever. °· You have swollen neck glands. °· You have pain while swallowing. °· You have white areas in the back of your throat. °SEEK IMMEDIATE MEDICAL CARE IF:  °· You have severe or persistent: °¨ Headache. °¨ Ear pain. °¨ Sinus pain. °¨ Chest pain. °· You have chronic lung disease and any of the following: °¨ Wheezing. °¨ Prolonged cough. °¨ Coughing up blood. °¨ A change in your usual mucus. °· You have a stiff neck. °· You have changes in your: °¨ Vision. °¨ Hearing. °¨ Thinking. °¨ Mood. °MAKE SURE YOU:  °· Understand these instructions. °· Will watch your condition. °· Will get help right away if you are not doing well or get worse. °  °This information is not intended to replace advice given to you by your health care provider. Make sure you discuss any questions you have with your health care provider. °  °Document Released: 12/18/2000 Document Revised: 11/08/2014 Document Reviewed: 09/29/2013 °Elsevier Interactive Patient Education ©2016 Elsevier Inc. ° °

## 2015-11-12 NOTE — ED Notes (Signed)
Patient c/o believe he his having allergy symptom x 4 days. Per patient taking his zyrtec and singular , but not feeling better.

## 2015-11-12 NOTE — ED Provider Notes (Signed)
CSN: [id number]     Arrival date & time 11/12/15  1513 History   First MD Initiated Contact with Patient 11/12/15 1636     Chief Complaint  Patient presents with  . Allergic Rhinitis    (Consider location/radiation/quality/duration/timing/severity/associated sxs/prior Treatment) HPI  This is an 82 year old male who presents with shortness of breath productive cough and weakness. States that he felt that he was having allergy symptoms for 4 days but "got the better of me". He is a poor historian but does relate that he's been having shortness of breath no chest pain and a productive cough copious amounts of mucus. He is having chills although he is afebrile today at 97.4.  Past Medical History  Diagnosis Date  . Allergic rhinitis 08/04/2014  . Aortic heart valve narrowing 11/05/2011    Overview:  a.  03/2010:  moderate.  33 mmHg peak grad 24 mmHg mean grad 1.3 cm2    . Atrial flutter ([place]) 04/16/2013    Overview:  10/14 hosp, felt to be secondary to choking episode.  TEE and DC CV.   Marland Kitchen Benign fibroma of prostate 11/05/2011    Overview:  a. Status post prostatic biopsy x 2 which have been negative.   Marland Kitchen CAD in native artery 08/04/2014    Overview:  a. Status post PCI of RCA x 2.  b. 4/00 Positive Myoview. Catheterization revealed EF 70% with 3-vessel disease.  c. 11/01/98 CABG x 3 (by Dr. Ronnald Ramp), LIMA to LAD, SVG to RCA, SVG to ramus.  d. 7/00 catheterization, 100% LIMA, patent vein grafts.  e. 7/00 & 9/00 PCI of LAD.  f. 1/01 Redo CABG (by Dr. Corinna Capra), RIMA to distal LAD, SVG to D1, EF 65% at time of cardiac catheterization.     . Chronic otitis externa 11/05/2011  . Compressed spine fracture ([place]) 11/05/2011    Overview:  11/97 Lumbar collapsed vertebrae with impingement.    . Deafness, sensorineural 12/30/2012  . HLD (hyperlipidemia) 11/05/2011    Overview:  a. 2/07 Lipid profile:  TC 239, TG 232, HDL 35, LDL 158.  b. Many intolerances to Lipid medications.   . BP (high blood pressure) 11/05/2011  .  Mitral stenosis 12/28/2013    Overview:  Moderate per ECHO 10/14 with mean grad 4 and peak grad 17mmHg ALSO mention of mid LV gradient of 104mmHG   . Arthritis or polyarthritis, rheumatoid ([place]) 11/05/2011  . Bacterial urinary infection 11/05/2011    Overview:  Urinary tract infections/prostatitis.  Followed by a urologist in [place], [place].    Past Surgical History  Procedure Laterality Date  . Prostate surgery    . Cardiac surgery  1991   Family History  Problem Relation Age of Onset  . Leukemia Father   . Heart failure Mother   . Hypertension Paternal Grandmother   . Heart attack Maternal Grandfather    Social History  Substance Use Topics  . Smoking status: Never Smoker   . Smokeless tobacco: Not on file  . Alcohol Use: 0.0 oz/week    0 Standard drinks or equivalent per week    Review of Systems  Constitutional: Positive for chills, activity change and fatigue. Negative for diaphoresis.  HENT: Positive for congestion, postnasal drip, rhinorrhea, sneezing and sore throat.   Respiratory: Positive for cough and shortness of breath. Negative for wheezing and stridor.   Cardiovascular: Negative for chest pain and palpitations.  All other systems reviewed and are negative.   Allergies  Eggs or egg-derived products; Lisinopril; Atorvastatin; Fenofibrate;  Niacin; Simvastatin; Soy allergy; Wheat bran; Aspirin; and Nitrofuran derivatives  Home Medications   Prior to Admission medications   Medication Sig Start Date End Date Taking? Authorizing Provider  aspirin 81 MG tablet Take by mouth. 08/22/08  Yes Historical Provider, MD  cetirizine (ZYRTEC) 10 MG tablet Take by mouth.   Yes Historical Provider, MD  Cranberry-Vitamin C-Vitamin E (CRANBERRY PLUS VITAMIN C) 4200-20-3 MG-MG-UNIT CAPS Take by mouth.   Yes Historical Provider, MD  diphenhydrAMINE (BENADRYL) 25 mg capsule Take by mouth.   Yes Historical Provider, MD  ELIQUIS 2.5 MG TABS tablet  04/02/15  Yes Historical Provider, MD   finasteride (PROSCAR) 5 MG tablet Take 1 tablet (5 mg total) by mouth daily. 07/14/15  Yes Roda Shutters, FNP  fluticasone (FLONASE) 50 MCG/ACT nasal spray Place into the nose. 11/16/10  Yes Historical Provider, MD  Glucosamine HCl (SM GLUCOSAMINE HCL) 1500 MG TABS Take by mouth. 05/30/15  Yes Historical Provider, MD  lansoprazole (PREVACID) 15 MG capsule Take by mouth.   Yes Historical Provider, MD  losartan (COZAAR) 50 MG tablet  04/02/15  Yes Historical Provider, MD  magnesium chloride (SLOW-MAG) 64 MG TBEC SR tablet Take by mouth.   Yes Historical Provider, MD  metoprolol tartrate (LOPRESSOR) 25 MG tablet  01/04/15  Yes Historical Provider, MD  montelukast (SINGULAIR) 10 MG tablet  04/02/15  Yes Historical Provider, MD  nitroGLYCERIN (NITROSTAT) 0.4 MG SL tablet Place 1 tablet under tongue as needed for chest pain (may repeat every 5 minutes but seek medical help if pain persists after 3 tablets) as needed 08/22/08  Yes Historical Provider, MD  Omega-3 Fatty Acids (FISH OIL) 1200 MG CAPS Take by mouth.   Yes Historical Provider, MD  senna-docusate (SENOKOT-S) 8.6-50 MG tablet Take by mouth.   Yes Historical Provider, MD  tamsulosin (FLOMAX) 0.4 MG CAPS capsule Take 1 capsule (0.4 mg total) by mouth daily. 07/14/15  Yes Roda Shutters, FNP  VITAMIN B COMPLEX-C CAPS Take by mouth. 05/30/15 05/29/16 Yes Historical Provider, MD  albuterol (PROVENTIL HFA;VENTOLIN HFA) 108 (90 Base) MCG/ACT inhaler Inhale 1-2 puffs into the lungs every 6 (six) hours as needed for wheezing or shortness of breath. 11/12/15   Lorin Picket, PA-C  azithromycin (ZITHROMAX Z-PAK) 250 MG tablet Use as per package instructions 11/12/15   Lorin Picket, PA-C   Meds Ordered and Administered this Visit  Medications - No data to display  BP 124/60 mmHg  Pulse 60  Temp(Src) 97.4 F (36.3 C) (Oral)  Resp 16  Ht 5\' 6"  (1.676 m)  Wt 174 lb (78.926 kg)  BMI 28.10 kg/m2  SpO2 97% No data found.   Physical Exam   Constitutional: He appears well-developed and well-nourished. No distress.  HENT:  Head: Normocephalic and atraumatic.  Right Ear: External ear normal.  Left Ear: External ear normal.  Nose: Nose normal.  Mouth/Throat: Oropharynx is clear and moist. No oropharyngeal exudate.  Eyes: Conjunctivae are normal. Pupils are equal, round, and reactive to light.  Neck: Normal range of motion. Neck supple.  Pulmonary/Chest: Effort normal. No respiratory distress. He has rales.  Patient has non-tussive by basilar crackles  Musculoskeletal: Normal range of motion. He exhibits no edema or tenderness.  Neurological: He is alert.  Skin: Skin is warm and dry. He is not diaphoretic.  Psychiatric: He has a normal mood and affect. His behavior is normal. Judgment and thought content normal.  Nursing note and vitals reviewed.   ED Course  Procedures (including  critical care time)  Labs Review Labs Reviewed - No data to display  Imaging Review Dg Chest 2 View  11/12/2015  CLINICAL DATA:  82 year old male the history of productive cough. Sinus drainage. EXAM: CHEST - 2 VIEW COMPARISON:  None. FINDINGS: Cardiomediastinal silhouette within normal limits in size and contour. Surgical changes of prior median sternotomy and CABG. Evidence of prior PTCA Low lung volumes. No pleural effusion or pneumothorax. No confluent airspace disease. Coarsened interstitial markings. Degenerative changes bilateral shoulders. No displaced fracture. Unremarkable appearance of the upper abdomen. IMPRESSION: No radiographic evidence of acute cardiopulmonary disease. Background chronic changes. Surgical changes of median sternotomy and CABG, with evidence of prior PTCA. Signed, Dulcy Fanny. Earleen Newport, DO [facility] Electronically Signed   By: Corrie Mckusick D.O.   On: 11/12/2015 17:49     Visual Acuity Review  Right Eye Distance:   Left Eye Distance:   Bilateral Distance:     Right Eye Near:   Left Eye Near:    Bilateral Near:         MDM   1. Bronchitis    New Prescriptions   ALBUTEROL (PROVENTIL HFA;VENTOLIN HFA) 108 (90 BASE) MCG/ACT INHALER    Inhale 1-2 puffs into the lungs every 6 (six) hours as needed for wheezing or shortness of breath.   AZITHROMYCIN (ZITHROMAX Z-PAK) 250 MG TABLET    Use as per package instructions  Plan: 1. Test/x-ray results and diagnosis reviewed with patient 2. rx as per orders; risks, benefits, potential side effects reviewed with patient 3. Recommend supportive treatment with Tylenol or Motrin for fever and chills. Examination does not appear toxic at all today he is accompanied by his daughter. Told them that if he is worsening has high fevers or difficulty breathing he should go immediately to the emergency department. Otherwise I have asked him to contact Dr. Dorthula Perfect tomorrow and arrange an appointment this week. 4. F/u primary care physician next week.   Lorin Picket, PA-C 11/12/15 [phone]

## 2016-01-04 ENCOUNTER — Ambulatory Visit (INDEPENDENT_AMBULATORY_CARE_PROVIDER_SITE_OTHER): Payer: Medicare HMO | Admitting: Urology

## 2016-01-04 ENCOUNTER — Encounter: Payer: Self-pay | Admitting: Urology

## 2016-01-04 VITALS — BP 111/60 | HR 71 | Ht 66.0 in | Wt 179.0 lb

## 2016-01-04 DIAGNOSIS — R339 Retention of urine, unspecified: Secondary | ICD-10-CM | POA: Diagnosis not present

## 2016-01-04 DIAGNOSIS — C61 Malignant neoplasm of prostate: Secondary | ICD-10-CM | POA: Diagnosis not present

## 2016-01-04 DIAGNOSIS — N5319 Other ejaculatory dysfunction: Secondary | ICD-10-CM | POA: Diagnosis not present

## 2016-01-04 MED ORDER — FINASTERIDE 5 MG PO TABS: 5.0000 mg | ORAL_TABLET | Freq: Every day | ORAL | Status: DC

## 2016-01-04 MED ORDER — TAMSULOSIN HCL 0.4 MG PO CAPS: 0.4000 mg | ORAL_CAPSULE | Freq: Every day | ORAL | Status: DC

## 2016-01-04 NOTE — Patient Instructions (Signed)
Continue the finasteride  Stop the tamsulosin for the dripping

## 2016-01-04 NOTE — Progress Notes (Signed)
11:43 PM   Richard Elliott 1925/05/31 [id number]  Referring provider: Ezequiel Kayser, MD [facility] [place], [place] [address]  Chief Complaint  Patient presents with  . Prostate Cancer    43month follow up    HPI: Patient is an 82 year old Caucasian male with a history of urinary retention and prostate cancer who presents today for 6 month follow-up.  Patient is status post cryoablation by Dr. Ernst Spell in 2010.  His last PSA on 10/23/2015 was 0.1.  His urinary symptoms are much improved.  He reports a good urinary flow, minimal hesitancy, no sensation of incomplete bladder emptying. He does state that he experiences some occasional leakage including post void dribbling. He states that he previously was wearing tight fitting underwear and has since switched to boxer briefs which he believes has also significantly improved his urinary leakage. He is doing well on Flomax and finasteride.    Patient is complaining of a penile seepage which he attributes to his semen.  He states it is a white mucus.    IPSS score is 14/3.        IPSS      01/04/16 1000       International Prostate Symptom Score   How often have you had the sensation of not emptying your bladder? About half the time     How often have you had to urinate less than every two hours? Less than half the time     How often have you found you stopped and started again several times when you urinated? Less than half the time     How often have you found it difficult to postpone urination? Less than half the time     How often have you had a weak urinary stream? Less than half the time     How often have you had to strain to start urination? Less than half the time     How many times did you typically get up at night to urinate? 1 Time     Total IPSS Score 14     Quality of Life due to urinary symptoms   If you were to spend the rest of your life with your urinary condition just the way it is now  how would you feel about that? Mixed        Score:  1-7 Mild 8-19 Moderate 20-35 Severe   PMH: Past Medical History  Diagnosis Date  . Allergic rhinitis 08/04/2014  . Aortic heart valve narrowing 11/05/2011    Overview:  a.  03/2010:  moderate.  33 mmHg peak grad 24 mmHg mean grad 1.3 cm2    . Atrial flutter ([place]) 04/16/2013    Overview:  10/14 hosp, felt to be secondary to choking episode.  TEE and DC CV.   Marland Kitchen Benign fibroma of prostate 11/05/2011    Overview:  a. Status post prostatic biopsy x 2 which have been negative.   Marland Kitchen CAD in native artery 08/04/2014    Overview:  a. Status post PCI of RCA x 2.  b. 4/00 Positive Myoview. Catheterization revealed EF 70% with 3-vessel disease.  c. 11/01/98 CABG x 3 (by Dr. Ronnald Ramp), LIMA to LAD, SVG to RCA, SVG to ramus.  d. 7/00 catheterization, 100% LIMA, patent vein grafts.  e. 7/00 & 9/00 PCI of LAD.  f. 1/01 Redo CABG (by Dr. Corinna Capra), RIMA to distal LAD, SVG to D1, EF 65% at time of cardiac catheterization.     Marland Kitchen  Chronic otitis externa 11/05/2011  . Compressed spine fracture ([place]) 11/05/2011    Overview:  11/97 Lumbar collapsed vertebrae with impingement.    . Deafness, sensorineural 12/30/2012  . HLD (hyperlipidemia) 11/05/2011    Overview:  a. 2/07 Lipid profile:  TC 239, TG 232, HDL 35, LDL 158.  b. Many intolerances to Lipid medications.   . BP (high blood pressure) 11/05/2011  . Mitral stenosis 12/28/2013    Overview:  Moderate per ECHO 10/14 with mean grad 4 and peak grad 46mmHg ALSO mention of mid LV gradient of 55mmHG   . Arthritis or polyarthritis, rheumatoid ([place]) 11/05/2011  . Bacterial urinary infection 11/05/2011    Overview:  Urinary tract infections/prostatitis.  Followed by a urologist in [place], [place].     Surgical History: Past Surgical History  Procedure Laterality Date  . Prostate surgery    . Cardiac surgery  1991    Home Medications:    Medication List       This list is accurate as of: 01/04/16 11:59 PM.  Always use your most  recent med list.               albuterol 108 (90 Base) MCG/ACT inhaler  Commonly known as:  PROVENTIL HFA;VENTOLIN HFA  Inhale 1-2 puffs into the lungs every 6 (six) hours as needed for wheezing or shortness of breath.     aspirin 81 MG tablet  Take by mouth.     cetirizine 10 MG tablet  Commonly known as:  ZYRTEC  Take by mouth.     CRANBERRY PLUS VITAMIN C 4200-20-3 MG-MG-UNIT Caps  Generic drug:  Cranberry-Vitamin C-Vitamin E  Take by mouth. Reported on 01/04/2016     diphenhydrAMINE 25 mg capsule  Commonly known as:  BENADRYL  Take by mouth.     ELIQUIS 2.5 MG Tabs tablet  Generic drug:  apixaban     finasteride 5 MG tablet  Commonly known as:  PROSCAR  Take 1 tablet (5 mg total) by mouth daily.     Fish Oil 1200 MG Caps  Take by mouth.     fluticasone 50 MCG/ACT nasal spray  Commonly known as:  FLONASE  Place into the nose.     losartan 50 MG tablet  Commonly known as:  COZAAR     magnesium chloride 64 MG Tbec SR tablet  Commonly known as:  SLOW-MAG  Take by mouth.     metoprolol tartrate 25 MG tablet  Commonly known as:  LOPRESSOR     montelukast 10 MG tablet  Commonly known as:  SINGULAIR     nitroGLYCERIN 0.4 MG SL tablet  Commonly known as:  NITROSTAT  Place 1 tablet under tongue as needed for chest pain (may repeat every 5 minutes but seek medical help if pain persists after 3 tablets) as needed     tamsulosin 0.4 MG Caps capsule  Commonly known as:  FLOMAX  Take 1 capsule (0.4 mg total) by mouth daily.        Allergies:  Allergies  Allergen Reactions  . Eggs Or Egg-Derived Products Anaphylaxis  . Lisinopril     Other reaction(s): Cough  . Atorvastatin     Other reaction(s): Other (See Comments) Pain to walk, stiffness in joints  . Fenofibrate     Other reaction(s): Other (See Comments) Leg Pain  . Niacin     Other reaction(s): Other (See Comments) Hot flashes  . Simvastatin     Other reaction(s): Other (See Comments) Pain to  walk and joint stiffness in legs  . Soy Allergy Other (See Comments)  . Wheat Bran     Other reaction(s): Unknown  . Aspirin Nausea Only    Other Reaction: GI Upset  . Nitrofuran Derivatives     Other reaction(s): Other (See Comments) Other Reaction: GI UPSET    Family History: Family History  Problem Relation Age of Onset  . Leukemia Father   . Heart failure Mother   . Hypertension Paternal Grandmother   . Heart attack Maternal Grandfather     Social History:  reports that he has never smoked. He does not have any smokeless tobacco history on file. He reports that he drinks alcohol. His drug history is not on file.  ROS: UROLOGY Frequent Urination?: No Hard to postpone urination?: No Burning/pain with urination?: No Get up at night to urinate?: No Leakage of urine?: No Urine stream starts and stops?: No Trouble starting stream?: No Do you have to strain to urinate?: No Blood in urine?: No Urinary tract infection?: No Sexually transmitted disease?: No Injury to kidneys or bladder?: No Painful intercourse?: No Weak stream?: No Erection problems?: No Penile pain?: No  Gastrointestinal Nausea?: No Vomiting?: No Indigestion/heartburn?: No Diarrhea?: No Constipation?: No  Constitutional Fever: No Night sweats?: No Weight loss?: No Fatigue?: No  Skin Skin rash/lesions?: No Itching?: No  Eyes Blurred vision?: No Double vision?: No  Ears/Nose/Throat Sore throat?: No Sinus problems?: Yes  Hematologic/Lymphatic Swollen glands?: No Easy bruising?: No  Cardiovascular Leg swelling?: No Chest pain?: No  Respiratory Cough?: No Shortness of breath?: Yes  Endocrine Excessive thirst?: No  Musculoskeletal Back pain?: No Joint pain?: Yes  Neurological Headaches?: No Dizziness?: No  Psychologic Depression?: No Anxiety?: No  Physical Exam: BP 111/60 mmHg  Pulse 71  Ht 5\' 6"  (1.676 m)  Wt 179 lb (81.194 kg)  BMI 28.91 kg/m2  Constitutional:  Well nourished. Alert and oriented, No acute distress. HEENT: [place] AT, moist mucus membranes. Trachea midline, no masses. Cardiovascular: No clubbing, cyanosis, or edema. Respiratory: Normal respiratory effort, no increased work of breathing. GI: Abdomen is soft, non tender, non distended, no abdominal masses. Liver and spleen not palpable.  No hernias appreciated.  Stool sample for occult testing is not indicated.   GU: No CVA tenderness.  No bladder fullness or masses.  Patient with uncircumcised phallus. Foreskin easily retracted  Urethral meatus is patent.  No penile discharge. No penile lesions or rashes. Scrotum without lesions, cysts, rashes and/or edema.  Testicles are located scrotally bilaterally. No masses are appreciated in the testicles. Left and right epididymis are normal. Rectal: Patient with  normal sphincter tone. Anus and perineum without scarring or rashes. No rectal masses are appreciated. Prostate is approximately 45 grams, firm in the right lobe, no nodules are appreciated. Seminal vesicles are normal. Skin: No rashes, bruises or suspicious lesions. Lymph: No cervical or inguinal adenopathy. Neurologic: Grossly intact, no focal deficits, moving all 4 extremities. Psychiatric: Normal mood and affect.  Laboratory Data: PSA History  0.1 ng/mL on 10/23/2015  Assessment & Plan:    1. Urinary retention- Patient reports no further episodes of urinary retention. He reports his symptoms have significantly proved on finasteride and Flomax. He is tolerating the medications well and is satisfied with symptom management. We will refill his prescriptions today.  2. Prostate Cancer- S/p cryoablation by Dr. Madelin Headings 2010.  Last PSA 0.1 ng/mL on 10/23/2015.  PSA nadir 0.3?  PSA drawn today.  If stable, we will see him in  6 months for IPSS, PSA and exam.    3. Ejaculatory disorder:  Patient will discontinue his tamsulosin and see if the seepage stops.    Return in about 6 months (around  07/05/2016) for IPSS, PSA and exam.  Zara Council, [facility]  [facility] [address] [place], [place] [address] [phone]

## 2016-01-16 ENCOUNTER — Ambulatory Visit (INDEPENDENT_AMBULATORY_CARE_PROVIDER_SITE_OTHER): Payer: Medicare HMO | Admitting: Urology

## 2016-01-16 ENCOUNTER — Encounter: Payer: Self-pay | Admitting: Urology

## 2016-01-16 VITALS — BP 144/60 | HR 66 | Ht 66.0 in | Wt 180.4 lb

## 2016-01-16 DIAGNOSIS — N5089 Other specified disorders of the male genital organs: Secondary | ICD-10-CM

## 2016-01-16 LAB — URINALYSIS, COMPLETE
Bilirubin, UA: NEGATIVE
Glucose, UA: NEGATIVE
KETONES UA: NEGATIVE
LEUKOCYTES UA: NEGATIVE
Nitrite, UA: NEGATIVE
Protein, UA: NEGATIVE
SPEC GRAV UA: 1.015 (ref 1.005–1.030)
Urobilinogen, Ur: 0.2 mg/dL (ref 0.2–1.0)
pH, UA: 5 (ref 5.0–7.5)

## 2016-01-16 LAB — MICROSCOPIC EXAMINATION: BACTERIA UA: NONE SEEN

## 2016-01-16 LAB — BLADDER SCAN AMB NON-IMAGING: Scan Result: 26

## 2016-01-16 MED ORDER — SULFAMETHOXAZOLE-TRIMETHOPRIM 800-160 MG PO TABS: 1.0000 | ORAL_TABLET | Freq: Two times a day (BID) | ORAL | Status: DC

## 2016-01-16 NOTE — Progress Notes (Signed)
01/16/2016 11:27 AM   Richard Elliott 28-Jun-1925 [id number]  Referring provider: Ezequiel Kayser, MD [facility] [place] [place], [place] [address]  Chief Complaint  Patient presents with  . Groin Swelling    noticed yesterday Testicle swollen Left    HPI: Richard Elliott is 82yo seen today for left testicular pain and swelling. He awoke yesterday morning and noticed his left testicle was swollen and tender to the touch. He denies any worsening LUTS.  He was on flomax for LUTS and he stopped it on the 6/29 due to urinary frequency and retrograde ejaculation.   PMH: Past Medical History  Diagnosis Date  . Allergic rhinitis 08/04/2014  . Aortic heart valve narrowing 11/05/2011    Overview:  a.  03/2010:  moderate.  33 mmHg peak grad 24 mmHg mean grad 1.3 cm2    . Atrial flutter ([place]) 04/16/2013    Overview:  10/14 hosp, felt to be secondary to choking episode.  TEE and DC CV.   Marland Kitchen Benign fibroma of prostate 11/05/2011    Overview:  a. Status post prostatic biopsy x 2 which have been negative.   Marland Kitchen CAD in native artery 08/04/2014    Overview:  a. Status post PCI of RCA x 2.  b. 4/00 Positive Myoview. Catheterization revealed EF 70% with 3-vessel disease.  c. 11/01/98 CABG x 3 (by Dr. Ronnald Ramp), LIMA to LAD, SVG to RCA, SVG to ramus.  d. 7/00 catheterization, 100% LIMA, patent vein grafts.  e. 7/00 & 9/00 PCI of LAD.  f. 1/01 Redo CABG (by Dr. Corinna Capra), RIMA to distal LAD, SVG to D1, EF 65% at time of cardiac catheterization.     . Chronic otitis externa 11/05/2011  . Compressed spine fracture ([place]) 11/05/2011    Overview:  11/97 Lumbar collapsed vertebrae with impingement.    . Deafness, sensorineural 12/30/2012  . HLD (hyperlipidemia) 11/05/2011    Overview:  a. 2/07 Lipid profile:  TC 239, TG 232, HDL 35, LDL 158.  b. Many intolerances to Lipid medications.   . BP (high blood pressure) 11/05/2011  . Mitral stenosis 12/28/2013    Overview:  Moderate per ECHO 10/14 with mean grad 4 and  peak grad 67mmHg ALSO mention of mid LV gradient of 67mmHG   . Arthritis or polyarthritis, rheumatoid ([place]) 11/05/2011  . Bacterial urinary infection 11/05/2011    Overview:  Urinary tract infections/prostatitis.  Followed by a urologist in [place], [place].     Surgical History: Past Surgical History  Procedure Laterality Date  . Prostate surgery    . Cardiac surgery  1991    Home Medications:    Medication List       This list is accurate as of: 01/16/16 11:27 AM.  Always use your most recent med list.               albuterol 108 (90 Base) MCG/ACT inhaler  Commonly known as:  PROVENTIL HFA;VENTOLIN HFA  Inhale 1-2 puffs into the lungs every 6 (six) hours as needed for wheezing or shortness of breath.     aspirin 81 MG tablet  Take by mouth.     cetirizine 10 MG tablet  Commonly known as:  ZYRTEC  Take by mouth.     CRANBERRY PLUS VITAMIN C 4200-20-3 MG-MG-UNIT Caps  Generic drug:  Cranberry-Vitamin C-Vitamin E  Take by mouth. Reported on 01/04/2016     diphenhydrAMINE 25 mg capsule  Commonly known as:  BENADRYL  Take by mouth. Reported  on 01/16/2016     ELIQUIS 2.5 MG Tabs tablet  Generic drug:  apixaban     finasteride 5 MG tablet  Commonly known as:  PROSCAR  Take 1 tablet (5 mg total) by mouth daily.     Fish Oil 1200 MG Caps  Take by mouth.     fluticasone 50 MCG/ACT nasal spray  Commonly known as:  FLONASE  Place into the nose.     lansoprazole 15 MG capsule  Commonly known as:  PREVACID  Take by mouth.     losartan 50 MG tablet  Commonly known as:  COZAAR     magnesium chloride 64 MG Tbec SR tablet  Commonly known as:  SLOW-MAG  Take by mouth.     metoprolol tartrate 25 MG tablet  Commonly known as:  LOPRESSOR     montelukast 10 MG tablet  Commonly known as:  SINGULAIR     nitroGLYCERIN 0.4 MG SL tablet  Commonly known as:  NITROSTAT  Place 1 tablet under tongue as needed for chest pain (may repeat every 5 minutes but seek medical help if pain  persists after 3 tablets) as needed     senna-docusate 8.6-50 MG tablet  Commonly known as:  Senokot-S  Take by mouth.     tamsulosin 0.4 MG Caps capsule  Commonly known as:  FLOMAX  Take 1 capsule (0.4 mg total) by mouth daily.        Allergies:  Allergies  Allergen Reactions  . Eggs Or Egg-Derived Products Anaphylaxis  . Lisinopril     Other reaction(s): Cough  . Atorvastatin     Other reaction(s): Other (See Comments) Pain to walk, stiffness in joints  . Fenofibrate     Other reaction(s): Other (See Comments) Leg Pain  . Niacin     Other reaction(s): Other (See Comments) Hot flashes  . Simvastatin     Other reaction(s): Other (See Comments) Pain to walk and joint stiffness in legs  . Soy Allergy Other (See Comments)  . Wheat Bran     Other reaction(s): Unknown  . Aspirin Nausea Only    Other Reaction: GI Upset  . Nitrofuran Derivatives     Other reaction(s): Other (See Comments) Other Reaction: GI UPSET    Family History: Family History  Problem Relation Age of Onset  . Leukemia Father   . Heart failure Mother   . Hypertension Paternal Grandmother   . Heart attack Maternal Grandfather   . Kidney disease Neg Hx   . Prostate cancer Neg Hx     Social History:  reports that he has never smoked. He does not have any smokeless tobacco history on file. He reports that he drinks alcohol. His drug history is not on file.  ROS: UROLOGY Frequent Urination?: No Hard to postpone urination?: No Burning/pain with urination?: No Get up at night to urinate?: No Leakage of urine?: No Urine stream starts and stops?: No Trouble starting stream?: No Do you have to strain to urinate?: No Blood in urine?: No Urinary tract infection?: No Sexually transmitted disease?: No Injury to kidneys or bladder?: No Painful intercourse?: No Weak stream?: No Erection problems?: No Penile pain?: No  Gastrointestinal Nausea?: No Vomiting?: No Indigestion/heartburn?:  No Diarrhea?: No Constipation?: No  Constitutional Fever: No Night sweats?: No Weight loss?: No Fatigue?: No  Skin Skin rash/lesions?: No Itching?: No  Eyes Blurred vision?: No Double vision?: No  Ears/Nose/Throat Sore throat?: No Sinus problems?: No  Hematologic/Lymphatic Swollen glands?: No Easy bruising?:  No  Cardiovascular Leg swelling?: No Chest pain?: No  Respiratory Cough?: No Shortness of breath?: Yes  Endocrine Excessive thirst?: No  Musculoskeletal Back pain?: Yes Joint pain?: Yes  Neurological Headaches?: No Dizziness?: No  Psychologic Depression?: No Anxiety?: No  Physical Exam: BP 144/60 mmHg  Pulse 66  Ht 5\' 6"  (1.676 m)  Wt 81.829 kg (180 lb 6.4 oz)  BMI 29.13 kg/m2  Constitutional:  Alert and oriented, No acute distress. HEENT: [place] AT, moist mucus membranes.  Trachea midline, no masses. Cardiovascular: No clubbing, cyanosis, or edema. Respiratory: Normal respiratory effort, no increased work of breathing. GI: Abdomen is soft, nontender, nondistended, no abdominal masses GU: No CVA tenderness. Uncircumcised phallus. No masses/lesions on penis and scrotum. Normal right testis. Left testis and epididymit firm, tender.  Skin: No rashes, bruises or suspicious lesions. Lymph: No cervical or inguinal adenopathy. Neurologic: Grossly intact, no focal deficits, moving all 4 extremities. Psychiatric: Normal mood and affect.  Laboratory Data: No results found for: WBC, HGB, HCT, MCV, PLT  No results found for: CREATININE  No results found for: PSA  No results found for: TESTOSTERONE  No results found for: HGBA1C  Urinalysis    Component Value Date/Time   APPEARANCEUR Clear 07/06/2015 1123   GLUCOSEU Negative 07/06/2015 1123   BILIRUBINUR Negative 07/06/2015 1123   PROTEINUR Negative 07/06/2015 1123   NITRITE Negative 07/06/2015 1123   LEUKOCYTESUR Negative 07/06/2015 1123    Pertinent Imaging: none  Assessment & Plan:     1. Left epididymo-orchitis -bactrim DS BID for 21 days -RTC 1 month with scrotal US - Urinalysis, Complete - BLADDER SCAN AMB NON-IMAGING   No Follow-up on file.  Nicolette Bang, MD  [facility] [address], [place], [place] [address] [phone]

## 2016-01-17 ENCOUNTER — Telehealth: Payer: Self-pay | Admitting: Urology

## 2016-01-17 DIAGNOSIS — N50819 Testicular pain, unspecified: Secondary | ICD-10-CM

## 2016-01-17 NOTE — Telephone Encounter (Signed)
You ordered a scrotal US for this patient but they scheduling dept says that he will also need an order for a doppler before they can schedule this. Can you please put in an order for this?  Thanks,  Sharyn Lull

## 2016-01-22 ENCOUNTER — Ambulatory Visit
Admission: RE | Admit: 2016-01-22 | Discharge: 2016-01-22 | Disposition: A | Discharge status: Home / Self Care | Payer: Medicare HMO | Source: Ambulatory Visit | Attending: Urology | Admitting: Urology

## 2016-01-22 DIAGNOSIS — I861 Scrotal varices: Secondary | ICD-10-CM | POA: Diagnosis not present

## 2016-01-22 DIAGNOSIS — N50819 Testicular pain, unspecified: Secondary | ICD-10-CM | POA: Insufficient documentation

## 2016-01-22 DIAGNOSIS — N503 Cyst of epididymis: Secondary | ICD-10-CM | POA: Insufficient documentation

## 2016-01-22 DIAGNOSIS — N433 Hydrocele, unspecified: Secondary | ICD-10-CM | POA: Insufficient documentation

## 2016-01-22 DIAGNOSIS — N5089 Other specified disorders of the male genital organs: Secondary | ICD-10-CM | POA: Insufficient documentation

## 2016-02-13 ENCOUNTER — Encounter: Payer: Self-pay | Admitting: Urology

## 2016-02-13 ENCOUNTER — Ambulatory Visit (INDEPENDENT_AMBULATORY_CARE_PROVIDER_SITE_OTHER): Payer: Medicare HMO | Admitting: Urology

## 2016-02-13 VITALS — BP 115/63 | HR 62 | Ht 66.0 in | Wt 176.6 lb

## 2016-02-13 DIAGNOSIS — N453 Epididymo-orchitis: Secondary | ICD-10-CM

## 2016-02-13 NOTE — Progress Notes (Signed)
02/13/2016 10:12 AM   Raquel James 1924/07/20 [id number]  Referring provider: Ezequiel Kayser, MD [facility] [place], [place] [address]  Chief Complaint  Patient presents with  . Follow-up    testicular pain, scrotal US    HPI: Mr Valcourt is a 82yo here for followup for left epididymo-orchitis. Pain resolved. Scrotal swelling resolved. He took 21 days of bactrim. Scrotal US showed left epididymo-orchitis.    PMH: Past Medical History:  Diagnosis Date  . Allergic rhinitis 08/04/2014  . Aortic heart valve narrowing 11/05/2011   Overview:  a.  03/2010:  moderate.  33 mmHg peak grad 24 mmHg mean grad 1.3 cm2    . Arthritis or polyarthritis, rheumatoid ([place]) 11/05/2011  . Atrial flutter ([place]) 04/16/2013   Overview:  10/14 hosp, felt to be secondary to choking episode.  TEE and DC CV.   . Bacterial urinary infection 11/05/2011   Overview:  Urinary tract infections/prostatitis.  Followed by a urologist in [place], [place].   Marland Kitchen Benign fibroma of prostate 11/05/2011   Overview:  a. Status post prostatic biopsy x 2 which have been negative.   . BP (high blood pressure) 11/05/2011  . CAD in native artery 08/04/2014   Overview:  a. Status post PCI of RCA x 2.  b. 4/00 Positive Myoview. Catheterization revealed EF 70% with 3-vessel disease.  c. 11/01/98 CABG x 3 (by Dr. Ronnald Ramp), LIMA to LAD, SVG to RCA, SVG to ramus.  d. 7/00 catheterization, 100% LIMA, patent vein grafts.  e. 7/00 & 9/00 PCI of LAD.  f. 1/01 Redo CABG (by Dr. Corinna Capra), RIMA to distal LAD, SVG to D1, EF 65% at time of cardiac catheterization.     . Chronic otitis externa 11/05/2011  . Compressed spine fracture ([place]) 11/05/2011   Overview:  11/97 Lumbar collapsed vertebrae with impingement.    . Deafness, sensorineural 12/30/2012  . HLD (hyperlipidemia) 11/05/2011   Overview:  a. 2/07 Lipid profile:  TC 239, TG 232, HDL 35, LDL 158.  b. Many intolerances to Lipid medications.   . Mitral stenosis 12/28/2013   Overview:  Moderate per ECHO 10/14 with mean grad 4 and peak grad 49mmHg ALSO mention of mid LV gradient of 70mmHG     Surgical History: Past Surgical History:  Procedure Laterality Date  . CARDIAC SURGERY  1991  . PROSTATE SURGERY      Home Medications:    Medication List       Accurate as of 02/13/16 10:12 AM. Always use your most recent med list.          albuterol 108 (90 Base) MCG/ACT inhaler Commonly known as:  PROVENTIL HFA;VENTOLIN HFA Inhale 1-2 puffs into the lungs every 6 (six) hours as needed for wheezing or shortness of breath.   aspirin 81 MG tablet Take by mouth.   cetirizine 10 MG tablet Commonly known as:  ZYRTEC Take by mouth.   CRANBERRY PLUS VITAMIN C 4200-20-3 MG-MG-UNIT Caps Generic drug:  Cranberry-Vitamin C-Vitamin E Take by mouth. Reported on 01/04/2016   diphenhydrAMINE 25 mg capsule Commonly known as:  BENADRYL Take by mouth. Reported on 01/16/2016   ELIQUIS 2.5 MG Tabs tablet Generic drug:  apixaban   finasteride 5 MG tablet Commonly known as:  PROSCAR Take 1 tablet (5 mg total) by mouth daily.   Fish Oil 1200 MG Caps Take by mouth.   fluticasone 50 MCG/ACT nasal spray Commonly known as:  FLONASE Place into the nose.   lansoprazole 15  MG capsule Commonly known as:  PREVACID Take by mouth.   losartan 50 MG tablet Commonly known as:  COZAAR   magnesium chloride 64 MG Tbec SR tablet Commonly known as:  SLOW-MAG Take by mouth.   metoprolol tartrate 25 MG tablet Commonly known as:  LOPRESSOR   montelukast 10 MG tablet Commonly known as:  SINGULAIR   nitroGLYCERIN 0.4 MG SL tablet Commonly known as:  NITROSTAT Place 1 tablet under tongue as needed for chest pain (may repeat every 5 minutes but seek medical help if pain persists after 3 tablets) as needed   senna-docusate 8.6-50 MG tablet Commonly known as:  Senokot-S Take by mouth.   tamsulosin 0.4 MG Caps capsule Commonly known as:  FLOMAX Take 1 capsule (0.4 mg total)  by mouth daily.       Allergies:  Allergies  Allergen Reactions  . Eggs Or Egg-Derived Products Anaphylaxis  . Lisinopril     Other reaction(s): Cough  . Atorvastatin     Other reaction(s): Other (See Comments) Pain to walk, stiffness in joints  . Fenofibrate     Other reaction(s): Other (See Comments) Leg Pain  . Niacin     Other reaction(s): Other (See Comments) Hot flashes  . Simvastatin     Other reaction(s): Other (See Comments) Pain to walk and joint stiffness in legs  . Soy Allergy Other (See Comments)    Other reaction(s): Unknown  . Wheat Bran     Other reaction(s): Unknown Other reaction(s): Unknown  . Aspirin Nausea Only    Other Reaction: GI Upset  . Nitrofuran Derivatives     Other reaction(s): Other (See Comments) Other Reaction: GI UPSET    Family History: Family History  Problem Relation Age of Onset  . Leukemia Father   . Heart failure Mother   . Hypertension Paternal Grandmother   . Heart attack Maternal Grandfather   . Kidney disease Neg Hx   . Prostate cancer Neg Hx     Social History:  reports that he has never smoked. He does not have any smokeless tobacco history on file. He reports that he drinks alcohol. His drug history is not on file.  ROS: UROLOGY Frequent Urination?: No Hard to postpone urination?: No Burning/pain with urination?: No Get up at night to urinate?: No Leakage of urine?: No Urine stream starts and stops?: No Trouble starting stream?: No Do you have to strain to urinate?: No Blood in urine?: No Urinary tract infection?: No Sexually transmitted disease?: No Injury to kidneys or bladder?: No Painful intercourse?: No Weak stream?: No Erection problems?: No Penile pain?: No  Gastrointestinal Nausea?: No Vomiting?: No Indigestion/heartburn?: No Diarrhea?: No Constipation?: No  Constitutional Fever: No Night sweats?: No Weight loss?: No Fatigue?: No  Skin Skin rash/lesions?: No Itching?:  No  Eyes Blurred vision?: No Double vision?: No  Ears/Nose/Throat Sore throat?: No Sinus problems?: No  Hematologic/Lymphatic Swollen glands?: No Easy bruising?: No  Cardiovascular Leg swelling?: No Chest pain?: No  Respiratory Cough?: No Shortness of breath?: No  Endocrine Excessive thirst?: No  Musculoskeletal Back pain?: No Joint pain?: No  Neurological Headaches?: No Dizziness?: No  Psychologic Depression?: No Anxiety?: No  Physical Exam: BP 115/63 (BP Location: Left Arm, Patient Position: Sitting, Cuff Size: Large)   Pulse 62   Ht 5\' 6"  (1.676 m)   Wt 80.1 kg (176 lb 9.6 oz)   BMI 28.50 kg/m   Constitutional:  Alert and oriented, No acute distress. HEENT: [place] AT, moist mucus membranes.  Trachea midline, no masses. Cardiovascular: No clubbing, cyanosis, or edema. Respiratory: Normal respiratory effort, no increased work of breathing. GI: Abdomen is soft, nontender, nondistended, no abdominal masses GU: No CVA tenderness. Uncircumcised. Phallus. Left testicle no masses/lesions. Left epididymis 1cm body mass. Skin: No rashes, bruises or suspicious lesions. Lymph: No cervical or inguinal adenopathy. Neurologic: Grossly intact, no focal deficits, moving all 4 extremities. Psychiatric: Normal mood and affect.  Laboratory Data: No results found for: WBC, HGB, HCT, MCV, PLT  No results found for: CREATININE  No results found for: PSA  No results found for: TESTOSTERONE  No results found for: HGBA1C  Urinalysis    Component Value Date/Time   APPEARANCEUR Clear 01/16/2016 1120   GLUCOSEU Negative 01/16/2016 1120   BILIRUBINUR Negative 01/16/2016 1120   PROTEINUR Negative 01/16/2016 1120   NITRITE Negative 01/16/2016 1120   LEUKOCYTESUR Negative 01/16/2016 1120    Pertinent Imaging: scortal [place]  Assessment & Plan:    1. Epididymo-orchitis -resolved RTC Dec 2017 for BPH with LUTS  There are no diagnoses linked to this encounter.  No  Follow-up on file.  Nicolette Bang, MD  [facility] [address] [place], [place] [address] [phone]

## 2016-03-20 DIAGNOSIS — I700 Atherosclerosis of aorta: Secondary | ICD-10-CM | POA: Insufficient documentation

## 2016-03-26 DIAGNOSIS — W010XXA Fall on same level from slipping, tripping and stumbling without subsequent striking against object, initial encounter: Secondary | ICD-10-CM | POA: Insufficient documentation

## 2016-03-26 DIAGNOSIS — Z7901 Long term (current) use of anticoagulants: Secondary | ICD-10-CM | POA: Diagnosis not present

## 2016-03-26 DIAGNOSIS — I700 Atherosclerosis of aorta: Secondary | ICD-10-CM | POA: Diagnosis not present

## 2016-03-26 DIAGNOSIS — R55 Syncope and collapse: Secondary | ICD-10-CM | POA: Diagnosis not present

## 2016-03-26 DIAGNOSIS — I2510 Atherosclerotic heart disease of native coronary artery without angina pectoris: Secondary | ICD-10-CM | POA: Diagnosis not present

## 2016-03-26 DIAGNOSIS — Z91018 Allergy to other foods: Secondary | ICD-10-CM | POA: Diagnosis not present

## 2016-03-26 DIAGNOSIS — I4891 Unspecified atrial fibrillation: Secondary | ICD-10-CM | POA: Insufficient documentation

## 2016-03-26 DIAGNOSIS — Z91012 Allergy to eggs: Secondary | ICD-10-CM | POA: Diagnosis not present

## 2016-03-26 DIAGNOSIS — Z886 Allergy status to analgesic agent status: Secondary | ICD-10-CM | POA: Diagnosis not present

## 2016-03-26 DIAGNOSIS — H905 Unspecified sensorineural hearing loss: Secondary | ICD-10-CM | POA: Insufficient documentation

## 2016-03-26 DIAGNOSIS — I110 Hypertensive heart disease with heart failure: Secondary | ICD-10-CM | POA: Insufficient documentation

## 2016-03-26 DIAGNOSIS — M069 Rheumatoid arthritis, unspecified: Secondary | ICD-10-CM | POA: Insufficient documentation

## 2016-03-26 DIAGNOSIS — N400 Enlarged prostate without lower urinary tract symptoms: Secondary | ICD-10-CM | POA: Insufficient documentation

## 2016-03-26 DIAGNOSIS — J309 Allergic rhinitis, unspecified: Secondary | ICD-10-CM | POA: Insufficient documentation

## 2016-03-26 DIAGNOSIS — R778 Other specified abnormalities of plasma proteins: Secondary | ICD-10-CM | POA: Insufficient documentation

## 2016-03-26 DIAGNOSIS — I083 Combined rheumatic disorders of mitral, aortic and tricuspid valves: Secondary | ICD-10-CM | POA: Insufficient documentation

## 2016-03-26 DIAGNOSIS — R7989 Other specified abnormal findings of blood chemistry: Secondary | ICD-10-CM | POA: Diagnosis present

## 2016-03-26 DIAGNOSIS — Z79899 Other long term (current) drug therapy: Secondary | ICD-10-CM | POA: Diagnosis not present

## 2016-03-26 DIAGNOSIS — I6523 Occlusion and stenosis of bilateral carotid arteries: Secondary | ICD-10-CM | POA: Insufficient documentation

## 2016-03-26 DIAGNOSIS — Z951 Presence of aortocoronary bypass graft: Secondary | ICD-10-CM | POA: Diagnosis not present

## 2016-03-26 DIAGNOSIS — I739 Peripheral vascular disease, unspecified: Secondary | ICD-10-CM | POA: Diagnosis not present

## 2016-03-26 DIAGNOSIS — Z888 Allergy status to other drugs, medicaments and biological substances status: Secondary | ICD-10-CM | POA: Insufficient documentation

## 2016-03-26 DIAGNOSIS — E785 Hyperlipidemia, unspecified: Secondary | ICD-10-CM | POA: Insufficient documentation

## 2016-03-26 DIAGNOSIS — I5033 Acute on chronic diastolic (congestive) heart failure: Secondary | ICD-10-CM | POA: Diagnosis not present

## 2016-03-26 DIAGNOSIS — I4892 Unspecified atrial flutter: Secondary | ICD-10-CM | POA: Insufficient documentation

## 2016-03-26 DIAGNOSIS — Z7982 Long term (current) use of aspirin: Secondary | ICD-10-CM | POA: Diagnosis not present

## 2016-03-26 NOTE — ED Triage Notes (Signed)
Pt to triage via w/c with no distress noted; pt accomp by son who reports about 11pm, slipped & fell; no c/o injuries or pain; son st found him on ground with eyes open & awake but  "unresponsive" verbally for brief period; st EMS arrived with normal findings but "just wanted him checked out"

## 2016-03-27 ENCOUNTER — Observation Stay
Admission: EM | Admit: 2016-03-27 | Discharge: 2016-03-29 | Disposition: A | Discharge status: Home / Self Care | Payer: Medicare HMO | Attending: Internal Medicine | Admitting: Internal Medicine

## 2016-03-27 ENCOUNTER — Emergency Department: Payer: Medicare HMO

## 2016-03-27 ENCOUNTER — Encounter: Payer: Self-pay | Admitting: Internal Medicine

## 2016-03-27 ENCOUNTER — Observation Stay
Admit: 2016-03-27 | Discharge: 2016-03-27 | Disposition: A | Discharge status: Home / Self Care | Payer: Medicare HMO | Attending: Internal Medicine | Admitting: Internal Medicine

## 2016-03-27 ENCOUNTER — Observation Stay: Payer: Medicare HMO

## 2016-03-27 DIAGNOSIS — R778 Other specified abnormalities of plasma proteins: Secondary | ICD-10-CM

## 2016-03-27 DIAGNOSIS — W19XXXA Unspecified fall, initial encounter: Secondary | ICD-10-CM

## 2016-03-27 DIAGNOSIS — R7989 Other specified abnormal findings of blood chemistry: Secondary | ICD-10-CM

## 2016-03-27 DIAGNOSIS — R0902 Hypoxemia: Secondary | ICD-10-CM

## 2016-03-27 DIAGNOSIS — R55 Syncope and collapse: Secondary | ICD-10-CM | POA: Diagnosis present

## 2016-03-27 DIAGNOSIS — R262 Difficulty in walking, not elsewhere classified: Secondary | ICD-10-CM

## 2016-03-27 DIAGNOSIS — M6281 Muscle weakness (generalized): Secondary | ICD-10-CM

## 2016-03-27 LAB — URINALYSIS COMPLETE WITH MICROSCOPIC (ARMC ONLY)
BILIRUBIN URINE: NEGATIVE
Bacteria, UA: NONE SEEN
Glucose, UA: NEGATIVE mg/dL
HGB URINE DIPSTICK: NEGATIVE
KETONES UR: NEGATIVE mg/dL
LEUKOCYTES UA: NEGATIVE
Nitrite: NEGATIVE
PH: 5 (ref 5.0–8.0)
Protein, ur: 30 mg/dL — AB
Specific Gravity, Urine: 1.021 (ref 1.005–1.030)

## 2016-03-27 LAB — CBC
HCT: 34.6 % — ABNORMAL LOW (ref 40.0–52.0)
HEMATOCRIT: 31.5 % — AB (ref 40.0–52.0)
HEMOGLOBIN: 11.1 g/dL — AB (ref 13.0–18.0)
HEMOGLOBIN: 11.9 g/dL — AB (ref 13.0–18.0)
MCH: 32.1 pg (ref 26.0–34.0)
MCH: 32.9 pg (ref 26.0–34.0)
MCHC: 34.2 g/dL (ref 32.0–36.0)
MCHC: 35.2 g/dL (ref 32.0–36.0)
MCV: 93.5 fL (ref 80.0–100.0)
MCV: 93.7 fL (ref 80.0–100.0)
PLATELETS: 206 10*3/uL (ref 150–440)
Platelets: 185 10*3/uL (ref 150–440)
RBC: 3.37 MIL/uL — AB (ref 4.40–5.90)
RBC: 3.7 MIL/uL — AB (ref 4.40–5.90)
RDW: 14.2 % (ref 11.5–14.5)
RDW: 14.3 % (ref 11.5–14.5)
WBC: 7.1 10*3/uL (ref 3.8–10.6)
WBC: 7.5 10*3/uL (ref 3.8–10.6)

## 2016-03-27 LAB — BASIC METABOLIC PANEL
ANION GAP: 10 (ref 5–15)
ANION GAP: 7 (ref 5–15)
BUN: 27 mg/dL — ABNORMAL HIGH (ref 6–20)
BUN: 32 mg/dL — ABNORMAL HIGH (ref 6–20)
CHLORIDE: 104 mmol/L (ref 101–111)
CO2: 21 mmol/L — ABNORMAL LOW (ref 22–32)
CO2: 24 mmol/L (ref 22–32)
CREATININE: 1.31 mg/dL — AB (ref 0.61–1.24)
Calcium: 9.1 mg/dL (ref 8.9–10.3)
Calcium: 9.5 mg/dL (ref 8.9–10.3)
Chloride: 105 mmol/L (ref 101–111)
Creatinine, Ser: 1.1 mg/dL (ref 0.61–1.24)
GFR calc non Af Amer: 47 mL/min — ABNORMAL LOW (ref >60)
GFR, EST AFRICAN AMERICAN: 54 mL/min — AB (ref >60)
GFR, EST NON AFRICAN AMERICAN: 58 mL/min — AB (ref >60)
GLUCOSE: 102 mg/dL — AB (ref 65–99)
Glucose, Bld: 136 mg/dL — ABNORMAL HIGH (ref 65–99)
POTASSIUM: 4.1 mmol/L (ref 3.5–5.1)
Potassium: 3.6 mmol/L (ref 3.5–5.1)
SODIUM: 135 mmol/L (ref 135–145)
Sodium: 136 mmol/L (ref 135–145)

## 2016-03-27 LAB — TROPONIN I
Troponin I: 0.09 ng/mL (ref <0.03)
Troponin I: 0.52 ng/mL (ref <0.03)
Troponin I: 0.59 ng/mL (ref <0.03)
Troponin I: 0.72 ng/mL (ref <0.03)

## 2016-03-27 LAB — ECHOCARDIOGRAM COMPLETE
HEIGHTINCHES: 66 in
WEIGHTICAEL: 2808 [oz_av]

## 2016-03-27 MED ORDER — HYDROCODONE-ACETAMINOPHEN 5-325 MG PO TABS
1.0000 | ORAL_TABLET | ORAL | Status: DC | PRN
  Administered 2016-03-27: 1 via ORAL
  Administered 2016-03-27 – 2016-03-28 (×3): 2 via ORAL
  Filled 2016-03-27 (×2): qty 2
  Filled 2016-03-27: qty 1
  Filled 2016-03-27: qty 2

## 2016-03-27 MED ORDER — ONDANSETRON HCL 4 MG PO TABS: 4.0000 mg | ORAL_TABLET | Freq: Four times a day (QID) | ORAL | Status: DC | PRN

## 2016-03-27 MED ORDER — ONDANSETRON HCL 4 MG/2ML IJ SOLN: 4.0000 mg | Freq: Four times a day (QID) | INTRAMUSCULAR | Status: DC | PRN

## 2016-03-27 MED ORDER — SODIUM CHLORIDE 0.9 % IV BOLUS (SEPSIS)
500.0000 mL | Freq: Once | INTRAVENOUS | Status: AC
  Administered 2016-03-27: 500 mL via INTRAVENOUS

## 2016-03-27 MED ORDER — SENNOSIDES-DOCUSATE SODIUM 8.6-50 MG PO TABS
1.0000 | ORAL_TABLET | Freq: Every day | ORAL | Status: DC
  Administered 2016-03-27 – 2016-03-28 (×2): 1 via ORAL
  Filled 2016-03-27 (×2): qty 1

## 2016-03-27 MED ORDER — MONTELUKAST SODIUM 10 MG PO TABS
10.0000 mg | ORAL_TABLET | Freq: Every day | ORAL | Status: DC
  Administered 2016-03-27 – 2016-03-28 (×2): 10 mg via ORAL
  Filled 2016-03-27 (×2): qty 1

## 2016-03-27 MED ORDER — MAGNESIUM CHLORIDE 64 MG PO TBEC
1.0000 | DELAYED_RELEASE_TABLET | Freq: Every day | ORAL | Status: DC
  Administered 2016-03-27 – 2016-03-29 (×4): 64 mg via ORAL
  Filled 2016-03-27 (×3): qty 1

## 2016-03-27 MED ORDER — PANTOPRAZOLE SODIUM 20 MG PO TBEC: 20.0000 mg | DELAYED_RELEASE_TABLET | Freq: Every day | ORAL | Status: DC

## 2016-03-27 MED ORDER — ACETAMINOPHEN 650 MG RE SUPP: 650.0000 mg | Freq: Four times a day (QID) | RECTAL | Status: DC | PRN

## 2016-03-27 MED ORDER — ASPIRIN 81 MG PO CHEW
324.0000 mg | CHEWABLE_TABLET | Freq: Once | ORAL | Status: AC
  Administered 2016-03-27: 324 mg via ORAL
  Filled 2016-03-27: qty 4

## 2016-03-27 MED ORDER — PANTOPRAZOLE SODIUM 40 MG PO TBEC
40.0000 mg | DELAYED_RELEASE_TABLET | Freq: Every day | ORAL | Status: DC
  Administered 2016-03-27 – 2016-03-29 (×3): 40 mg via ORAL
  Filled 2016-03-27 (×3): qty 1

## 2016-03-27 MED ORDER — SODIUM CHLORIDE 0.9% FLUSH
3.0000 mL | Freq: Two times a day (BID) | INTRAVENOUS | Status: DC
  Administered 2016-03-27 – 2016-03-28 (×2): 3 mL via INTRAVENOUS

## 2016-03-27 MED ORDER — KETOROLAC TROMETHAMINE 15 MG/ML IJ SOLN: 15.0000 mg | Freq: Four times a day (QID) | INTRAMUSCULAR | Status: DC | PRN

## 2016-03-27 MED ORDER — ALBUTEROL SULFATE (2.5 MG/3ML) 0.083% IN NEBU: 3.0000 mL | INHALATION_SOLUTION | Freq: Four times a day (QID) | RESPIRATORY_TRACT | Status: DC | PRN

## 2016-03-27 MED ORDER — ASPIRIN 81 MG PO CHEW
81.0000 mg | CHEWABLE_TABLET | Freq: Every day | ORAL | Status: DC
  Administered 2016-03-27 – 2016-03-29 (×3): 81 mg via ORAL
  Filled 2016-03-27 (×3): qty 1

## 2016-03-27 MED ORDER — MAGNESIUM HYDROXIDE 400 MG/5ML PO SUSP
15.0000 mL | Freq: Once | ORAL | Status: AC
  Administered 2016-03-27: 15 mL via ORAL
  Filled 2016-03-27: qty 30

## 2016-03-27 MED ORDER — FINASTERIDE 5 MG PO TABS
5.0000 mg | ORAL_TABLET | Freq: Every day | ORAL | Status: DC
  Administered 2016-03-27 – 2016-03-29 (×3): 5 mg via ORAL
  Filled 2016-03-27 (×3): qty 1

## 2016-03-27 MED ORDER — APIXABAN 5 MG PO TABS
5.0000 mg | ORAL_TABLET | Freq: Two times a day (BID) | ORAL | Status: DC
  Administered 2016-03-27 – 2016-03-29 (×4): 5 mg via ORAL
  Filled 2016-03-27 (×4): qty 1

## 2016-03-27 MED ORDER — ACETAMINOPHEN 325 MG PO TABS: 650.0000 mg | ORAL_TABLET | Freq: Four times a day (QID) | ORAL | Status: DC | PRN

## 2016-03-27 MED ORDER — LOSARTAN POTASSIUM 50 MG PO TABS
50.0000 mg | ORAL_TABLET | Freq: Every day | ORAL | Status: DC
  Administered 2016-03-27 – 2016-03-29 (×3): 50 mg via ORAL
  Filled 2016-03-27 (×3): qty 1

## 2016-03-27 MED ORDER — SODIUM CHLORIDE 0.9 % IV SOLN
INTRAVENOUS | Status: DC
  Administered 2016-03-27: 75 mL/h via INTRAVENOUS
  Administered 2016-03-27 – 2016-03-28 (×2): via INTRAVENOUS

## 2016-03-27 MED ORDER — NITROGLYCERIN 0.4 MG SL SUBL
0.4000 mg | SUBLINGUAL_TABLET | SUBLINGUAL | Status: DC | PRN
  Administered 2016-03-28: 0.4 mg via SUBLINGUAL
  Filled 2016-03-27: qty 1

## 2016-03-27 MED ORDER — APIXABAN 2.5 MG PO TABS
2.5000 mg | ORAL_TABLET | Freq: Two times a day (BID) | ORAL | Status: DC
  Administered 2016-03-27: 2.5 mg via ORAL
  Filled 2016-03-27: qty 1

## 2016-03-27 MED ORDER — METOPROLOL TARTRATE 25 MG PO TABS
25.0000 mg | ORAL_TABLET | Freq: Two times a day (BID) | ORAL | Status: DC
  Administered 2016-03-27 – 2016-03-29 (×5): 25 mg via ORAL
  Filled 2016-03-27 (×5): qty 1

## 2016-03-27 NOTE — Consult Note (Signed)
[facility]  CARDIOLOGY CONSULT NOTE  Patient ID: Richard Elliott MRN: [id number] DOB/AGE: 1924-07-24 82 y.o.  Admit date: 03/27/2016 Referring Physician Dr. Anselm Jungling Primary Physician   Primary Cardiologist Dr. Dorene Grebe Reason for Consultation syncope  HPI: Pt is an 82 yo male w/ PMH of CABG in 2000 and 2001 (Distal LAD to R-IMA, D1-SVG, Distal RCA-SVG, Ramus Intermedius-SVG; repeated in 2001 due to failed Distal LAD-LIMA), PVD w/ leg claudication (can walk 0.5 miles), well-controlled HTN, HLD, rheumatoid arthritis, moderate aortic stenosis, chronic otitis externa, and BPH w/ recurrent UTIs . History of afib treated with apixaban for anticoagulation and metoprolol tartrate. He underwent tee guided cardioversion in 2014. He presented to the er after a fall in his home. States he lost his balance and had a fall. He denies loss of consciousness. His son states that he did not witness the fall but stated that his father was somewhat disoriented when he cam to see what happened.  Unclear whether he lost conciousness prior to the fall. Head ct was unremarkable. Initial troponin was 0.09 and increased to 0.5. EKG showed nsr with no ischemic changes. Carotids are pending. cxr revealed no acute cardio pulmonary disease. He denis any chest pain. Pt underwent an echo which revealed preserved lv function but had evidence of at least moderate to severe aortic stenosis with aortic valve area of 0.84 cm2 with peak gradient of 54 and med of 33. Gradients were 33 and 24 previously. . Pt had moderate aortic stenosis described at [facility], [place]. He is limited significantly by his arthritic pain and has not noted exertional chest pain or sob or syncope. He states he did not have much chest pain prior to his cabgs.       Review of Systems  Constitutional: Negative.   HENT: Negative.   Eyes: Negative.   Respiratory: Negative.   Cardiovascular: Negative.   Gastrointestinal:  Negative.   Genitourinary: Negative.   Musculoskeletal: Positive for back pain, falls, joint pain, myalgias and neck pain.  Skin: Negative.   Endo/Heme/Allergies: Negative.   Psychiatric/Behavioral: Negative.     Past Medical History:  Diagnosis Date  . Allergic rhinitis 08/04/2014  . Aortic heart valve narrowing 11/05/2011   Overview:  a.  03/2010:  moderate.  33 mmHg peak grad 24 mmHg mean grad 1.3 cm2    . Arthritis or polyarthritis, rheumatoid ([place]) 11/05/2011  . Atrial flutter ([place]) 04/16/2013   Overview:  10/14 hosp, felt to be secondary to choking episode.  TEE and DC CV.   . Bacterial urinary infection 11/05/2011   Overview:  Urinary tract infections/prostatitis.  Followed by a urologist in [place], [place].   Marland Kitchen Benign fibroma of prostate 11/05/2011   Overview:  a. Status post prostatic biopsy x 2 which have been negative.   . BP (high blood pressure) 11/05/2011  . CAD in native artery 08/04/2014   Overview:  a. Status post PCI of RCA x 2.  b. 4/00 Positive Myoview. Catheterization revealed EF 70% with 3-vessel disease.  c. 11/01/98 CABG x 3 (by Dr. Ronnald Ramp), LIMA to LAD, SVG to RCA, SVG to ramus.  d. 7/00 catheterization, 100% LIMA, patent vein grafts.  e. 7/00 & 9/00 PCI of LAD.  f. 1/01 Redo CABG (by Dr. Corinna Capra), RIMA to distal LAD, SVG to D1, EF 65% at time of cardiac catheterization.     . Chronic otitis externa 11/05/2011  . Compressed spine fracture ([place]) 11/05/2011   Overview:  11/97 Lumbar collapsed vertebrae  with impingement.    . Deafness, sensorineural 12/30/2012  . HLD (hyperlipidemia) 11/05/2011   Overview:  a. 2/07 Lipid profile:  TC 239, TG 232, HDL 35, LDL 158.  b. Many intolerances to Lipid medications.   . Mitral stenosis 12/28/2013   Overview:  Moderate per ECHO 10/14 with mean grad 4 and peak grad 67mmHg ALSO mention of mid LV gradient of 55mmHG     Family History  Problem Relation Age of Onset  . Heart failure Mother   . Leukemia Father   . Hypertension Paternal Grandmother    . Heart attack Maternal Grandfather   . Kidney disease Neg Hx   . Prostate cancer Neg Hx     Social History   Social History  . Marital status: Married    Spouse name: N/A  . Number of children: N/A  . Years of education: N/A   Occupational History  . retired    Social History Main Topics  . Smoking status: Never Smoker  . Smokeless tobacco: Never Used  . Alcohol use 0.0 oz/week  . Drug use: Unknown  . Sexual activity: Not on file   Other Topics Concern  . Not on file   Social History Narrative  . No narrative on file    Past Surgical History:  Procedure Laterality Date  . CARDIAC SURGERY  1991  . PROSTATE SURGERY       Prescriptions Prior to Admission  Medication Sig Dispense Refill Last Dose  . albuterol (PROVENTIL HFA;VENTOLIN HFA) 108 (90 Base) MCG/ACT inhaler Inhale 1-2 puffs into the lungs every 6 (six) hours as needed for wheezing or shortness of breath. 1 Inhaler 0 Taking  . aspirin 81 MG tablet Take 81 mg by mouth daily.    03/26/2016 at Unknown time  . cetirizine (ZYRTEC) 10 MG tablet Take 10 mg by mouth daily.    03/26/2016 at Unknown time  . Cranberry-Vitamin C-Vitamin E (CRANBERRY PLUS VITAMIN C) 4200-20-3 MG-MG-UNIT CAPS Take 1 tablet by mouth daily. Reported on 01/04/2016   03/26/2016 at Unknown time  . diphenhydrAMINE (BENADRYL) 25 mg capsule Take 25 mg by mouth at bedtime as needed. Reported on 01/16/2016   03/26/2016 at Unknown time  . ELIQUIS 2.5 MG TABS tablet Take 2.5 mg by mouth 2 (two) times daily.    03/26/2016 at Unknown time  . finasteride (PROSCAR) 5 MG tablet Take 1 tablet (5 mg total) by mouth daily. 90 tablet 3 03/26/2016 at Unknown time  . lansoprazole (PREVACID) 15 MG capsule Take 15 mg by mouth daily at 12 noon.    03/26/2016 at Unknown time  . losartan (COZAAR) 50 MG tablet Take 50 mg by mouth daily.    03/26/2016 at Unknown time  . magnesium chloride (SLOW-MAG) 64 MG TBEC SR tablet Take 1 tablet by mouth daily.    03/26/2016 at Unknown time  .  metoprolol tartrate (LOPRESSOR) 25 MG tablet Take 25 mg by mouth 2 (two) times daily.    03/26/2016 at Unknown time  . montelukast (SINGULAIR) 10 MG tablet Take 10 mg by mouth at bedtime.    03/26/2016 at Unknown time  . nitroGLYCERIN (NITROSTAT) 0.4 MG SL tablet Place 1 tablet under tongue as needed for chest pain (may repeat every 5 minutes but seek medical help if pain persists after 3 tablets) as needed   prn  . Omega-3 Fatty Acids (FISH OIL) 1200 MG CAPS Take 1 capsule by mouth daily.    03/26/2016 at Unknown time  . senna-docusate (SENOKOT-S)  8.6-50 MG tablet Take 1 tablet by mouth at bedtime as needed.    03/26/2016 at Unknown time  . tamsulosin (FLOMAX) 0.4 MG CAPS capsule Take 1 capsule (0.4 mg total) by mouth daily. 90 capsule 3 03/26/2016 at Unknown time  . fluticasone (FLONASE) 50 MCG/ACT nasal spray Place into the nose.   prn    Physical Exam: Blood pressure (!) 128/55, pulse 77, temperature 98 F (36.7 C), resp. rate 17, height 5\' 6"  (1.676 m), weight 79.6 kg (175 lb 8 oz), SpO2 95 %.   Wt Readings from Last 1 Encounters:  03/27/16 79.6 kg (175 lb 8 oz)     General appearance: alert and cooperative Head: Normocephalic, without obvious abnormality, atraumatic Resp: clear to auscultation bilaterally Chest wall: right sided chest wall tenderness, left sided chest wall tenderness, right sided costochondral tenderness, left sided costochondral tenderness Cardio: regular rate and rhythm GI: soft, non-tender; bowel sounds normal; no masses,  no organomegaly Extremities: extremities normal, atraumatic, no cyanosis or edema Neurologic: Grossly normal  Labs:   Lab Results  Component Value Date   WBC 7.5 03/27/2016   HGB 11.1 (L) 03/27/2016   HCT 31.5 (L) 03/27/2016   MCV 93.5 03/27/2016   PLT 185 03/27/2016    Recent Labs Lab 03/27/16 0555  NA 136  K 4.1  CL 105  CO2 24  BUN 27*  CREATININE 1.10  CALCIUM 9.1  GLUCOSE 102*   Lab Results  Component Value Date    TROPONINI 0.59 ([place]) 03/27/2016      Radiology: no acute cardiopulmonary disease EKG: nsr  ASSESSMENT AND PLAN:  Patient is an 82 year old male with history of coronary artery disease status post coronary artery bypass grafting 2 in 2001 as well as a history of at least moderate aortic stenosis followed at [facility] per Dr. Dorene Grebe. He was admitted after suffering a fall today. It is unclear whether he had syncope. His son was sent home but not in the room and the patient fell. Patient states that he lost his balance due to severe leg pain due to his arthritis and fell to the floor. His son states that on his arrival his father was somewhat confused but responded quickly after this. He was brought to the emergency room where his initial serum troponin was 0.09 followed by a peak of 0.59. Patient denies any chest pain other than his arthritic pain. We discussed at length the echo findings that her valve has worsened somewhat. His valve area is 0.84. Certainly his aortic valve disease may be playing a role in his episode today. Patient is limited severely by his arthritis and has not noted chest pain or chest tightness or shortness of breath. Consideration for further workup of his coronary disease and/or aortic valve disease could be considered. He is hemodynamically stable now. Patient is not anxious to pursue any invasive diagnostic or interventional procedures present. Will attempt to optimize his medications. We will continue with Eliquis at 2.5 mg twice daily. Should the patient continue to have falls, consideration of discontinuing this could be raised. He is status post cardioversion in his sinus rhythm at present. Continue with current regimen ambulate Ceftin and in the morning if stable consider discharge with follow with Dr. Lillia Corporal. Signed: Teodoro Spray MD, [facility] 03/27/2016, 1:17 PM

## 2016-03-27 NOTE — Care Management (Signed)
Admitted to [facility] with the diagnosis of acute delirium . Son Elenore Rota lives with him [phone]). Seen Dr. Raechel Ache last Monday. No home health in the past. No skilled facility. No home oxygen. Uses no aids for ambulation. Prescriptions are filled at [facility] in [place]. Fair appetite. Lost 2-3 lbs lately. Fell last night. Takes care of all basic activities of daily living himself, drives. Son will transport. Shelbie Ammons RN MSN CCM Care Management [phone]

## 2016-03-27 NOTE — Progress Notes (Addendum)
Pt arrived from ED alert and oriented x 4. NS 75 mL/h NS infusing in rt hand,  no c/o pain. Pt oriented to staff and surroundings. Non skid socks on feet, skin assessed with Kristine Garbe.  No concerns offered at this time.

## 2016-03-27 NOTE — H&P (Signed)
[facility] [place] NAME: Richard Elliott    MR#:  [id number]  DATE OF BIRTH:  December 12, 1924  DATE OF ADMISSION:  03/27/2016  PRIMARY CARE PHYSICIAN: Ezequiel Kayser, MD   REQUESTING/REFERRING PHYSICIAN:   CHIEF COMPLAINT:   Chief Complaint  Patient presents with  . Fall    HISTORY OF PRESENT ILLNESS: Richard Elliott  is a 82 y.o. male with a known history of arthritis, atrial flutter, allergic rhinitis, benign prostatic hypertrophy, coronary artery disease, hyperlipidemia, sensorineural deafness, mitral stenosis presented to the emergency room with fall. Patient fell down in his living room. He lost balance and had a fall and passed out. No complaints of any chest pain. No history of any shortness of breath, orthopnea or proximal nocturnal dyspnea. Patient is awake and alert and responsive to all verbal commands. Patient is hard of hearing. He complains of pain in the back as well as in the left thigh secondary to arthritis. Patient was worked up in the emergency room with CT head which showed no acute intracranial abnormality. First set of troponin was 0.09 and EKG showed a normal sinus rhythm ST segment depression in lead 1 and aVL. Hospitalist service was consulted for further care of the patient.  PAST MEDICAL HISTORY:   Past Medical History:  Diagnosis Date  . Allergic rhinitis 08/04/2014  . Aortic heart valve narrowing 11/05/2011   Overview:  a.  03/2010:  moderate.  33 mmHg peak grad 24 mmHg mean grad 1.3 cm2    . Arthritis or polyarthritis, rheumatoid ([place]) 11/05/2011  . Atrial flutter ([place]) 04/16/2013   Overview:  10/14 hosp, felt to be secondary to choking episode.  TEE and DC CV.   . Bacterial urinary infection 11/05/2011   Overview:  Urinary tract infections/prostatitis.  Followed by a urologist in [place], [place].   Marland Kitchen Benign fibroma of prostate 11/05/2011   Overview:  a. Status post prostatic biopsy x 2 which have been negative.   . BP (high  blood pressure) 11/05/2011  . CAD in native artery 08/04/2014   Overview:  a. Status post PCI of RCA x 2.  b. 4/00 Positive Myoview. Catheterization revealed EF 70% with 3-vessel disease.  c. 11/01/98 CABG x 3 (by Dr. Ronnald Ramp), LIMA to LAD, SVG to RCA, SVG to ramus.  d. 7/00 catheterization, 100% LIMA, patent vein grafts.  e. 7/00 & 9/00 PCI of LAD.  f. 1/01 Redo CABG (by Dr. Corinna Capra), RIMA to distal LAD, SVG to D1, EF 65% at time of cardiac catheterization.     . Chronic otitis externa 11/05/2011  . Compressed spine fracture ([place]) 11/05/2011   Overview:  11/97 Lumbar collapsed vertebrae with impingement.    . Deafness, sensorineural 12/30/2012  . HLD (hyperlipidemia) 11/05/2011   Overview:  a. 2/07 Lipid profile:  TC 239, TG 232, HDL 35, LDL 158.  b. Many intolerances to Lipid medications.   . Mitral stenosis 12/28/2013   Overview:  Moderate per ECHO 10/14 with mean grad 4 and peak grad 6mmHg ALSO mention of mid LV gradient of 29mmHG     PAST SURGICAL HISTORY: Past Surgical History:  Procedure Laterality Date  . CARDIAC SURGERY  1991  . PROSTATE SURGERY      SOCIAL HISTORY:  Social History  Substance Use Topics  . Smoking status: Never Smoker  . Smokeless tobacco: Never Used  . Alcohol use 0.0 oz/week    FAMILY HISTORY:  Family History  Problem Relation Age of Onset  .  Heart failure Mother   . Leukemia Father   . Hypertension Paternal Grandmother   . Heart attack Maternal Grandfather   . Kidney disease Neg Hx   . Prostate cancer Neg Hx     DRUG ALLERGIES:  Allergies  Allergen Reactions  . Eggs Or Egg-Derived Products Anaphylaxis  . Lisinopril     Other reaction(s): Cough  . Atorvastatin     Other reaction(s): Other (See Comments) Pain to walk, stiffness in joints  . Fenofibrate     Other reaction(s): Other (See Comments) Leg Pain  . Niacin     Other reaction(s): Other (See Comments) Hot flashes  . Simvastatin     Other reaction(s): Other (See Comments) Pain to walk and  joint stiffness in legs  . Soy Allergy Other (See Comments)    Other reaction(s): Unknown  . Wheat Bran     Other reaction(s): Unknown Other reaction(s): Unknown  . Aspirin Nausea Only    Other Reaction: GI Upset  . Nitrofuran Derivatives     Other reaction(s): Other (See Comments) Other Reaction: GI UPSET    REVIEW OF SYSTEMS:   CONSTITUTIONAL: No fever, fatigue or weakness.  EYES: No blurred or double vision.  EARS, NOSE, AND THROAT: No tinnitus or ear pain.  RESPIRATORY: No cough, shortness of breath, wheezing or hemoptysis.  CARDIOVASCULAR: No chest pain, orthopnea, edema.  GASTROINTESTINAL: No nausea, vomiting, diarrhea or abdominal pain.  GENITOURINARY: No dysuria, hematuria.  ENDOCRINE: No polyuria, nocturia,  HEMATOLOGY: No anemia, easy bruising or bleeding SKIN: No rash or lesion. MUSCULOSKELETAL: has left hip joint pain secondary to arthritis.   NEUROLOGIC: No tingling, numbness, weakness.  PSYCHIATRY: No anxiety or depression.   MEDICATIONS AT HOME:  Prior to Admission medications   Medication Sig Start Date End Date Taking? Authorizing Provider  albuterol (PROVENTIL HFA;VENTOLIN HFA) 108 (90 Base) MCG/ACT inhaler Inhale 1-2 puffs into the lungs every 6 (six) hours as needed for wheezing or shortness of breath. 11/12/15   Lorin Picket, PA-C  aspirin 81 MG tablet Take by mouth. 08/22/08   Historical Provider, MD  cetirizine (ZYRTEC) 10 MG tablet Take by mouth.    Historical Provider, MD  Cranberry-Vitamin C-Vitamin E (CRANBERRY PLUS VITAMIN C) 4200-20-3 MG-MG-UNIT CAPS Take by mouth. Reported on 01/04/2016    Historical Provider, MD  diphenhydrAMINE (BENADRYL) 25 mg capsule Take by mouth. Reported on 01/16/2016    Historical Provider, MD  ELIQUIS 2.5 MG TABS tablet  04/02/15   Historical Provider, MD  finasteride (PROSCAR) 5 MG tablet Take 1 tablet (5 mg total) by mouth daily. 01/04/16   Larene Beach A McGowan, PA-C  fluticasone (FLONASE) 50 MCG/ACT nasal spray Place into the  nose. 11/16/10   Historical Provider, MD  lansoprazole (PREVACID) 15 MG capsule Take by mouth.    Historical Provider, MD  losartan (COZAAR) 50 MG tablet  04/02/15   Historical Provider, MD  magnesium chloride (SLOW-MAG) 64 MG TBEC SR tablet Take by mouth.    Historical Provider, MD  metoprolol tartrate (LOPRESSOR) 25 MG tablet  01/04/15   Historical Provider, MD  montelukast (SINGULAIR) 10 MG tablet  04/02/15   Historical Provider, MD  nitroGLYCERIN (NITROSTAT) 0.4 MG SL tablet Place 1 tablet under tongue as needed for chest pain (may repeat every 5 minutes but seek medical help if pain persists after 3 tablets) as needed 08/22/08   Historical Provider, MD  Omega-3 Fatty Acids (FISH OIL) 1200 MG CAPS Take by mouth.    Historical Provider, MD  senna-docusate (  SENOKOT-S) 8.6-50 MG tablet Take by mouth.    Historical Provider, MD  tamsulosin (FLOMAX) 0.4 MG CAPS capsule Take 1 capsule (0.4 mg total) by mouth daily. 01/04/16   Larene Beach A McGowan, PA-C      PHYSICAL EXAMINATION:   VITAL SIGNS: Blood pressure 138/64, pulse 66, temperature 97.5 F (36.4 C), temperature source Oral, resp. rate 17, height 5\' 6"  (1.676 m), weight 81.2 kg (179 lb), SpO2 95 %.  GENERAL:  82 y.o.-year-old patient lying in the bed with no acute distress.  EYES: Pupils equal, round, reactive to light and accommodation. No scleral icterus. Extraocular muscles intact.  HEENT: Head atraumatic, normocephalic. Oropharynx and nasopharynx clear.  NECK:  Supple, no jugular venous distention. No thyroid enlargement, no tenderness.  LUNGS: Normal breath sounds bilaterally, no wheezing, rales,rhonchi or crepitation. No use of accessory muscles of respiration.  CARDIOVASCULAR: S1, S2 normal. No murmurs, rubs, or gallops.  ABDOMEN: Soft, nontender, nondistended. Bowel sounds present. No organomegaly or mass.  EXTREMITIES: No pedal edema, cyanosis, or clubbing.  NEUROLOGIC: Cranial nerves II through XII are intact. Muscle strength 5/5 in all  extremities. Sensation intact. Gait not checked.  PSYCHIATRIC: The patient is alert and oriented x 3.  SKIN: No obvious rash, lesion, or ulcer.   LABORATORY PANEL:   CBC  Recent Labs Lab 03/26/16 2357  WBC 7.1  HGB 11.9*  HCT 34.6*  PLT 206  MCV 93.7  MCH 32.1  MCHC 34.2  RDW 14.3   ------------------------------------------------------------------------------------------------------------------  Chemistries   Recent Labs Lab 03/26/16 2357  NA 135  K 3.6  CL 104  CO2 21*  GLUCOSE 136*  BUN 32*  CREATININE 1.31*  CALCIUM 9.5   ------------------------------------------------------------------------------------------------------------------ estimated creatinine clearance is 39 mL/min (by C-G formula based on SCr of 1.31 mg/dL (H)). ------------------------------------------------------------------------------------------------------------------ No results for input(s): TSH, T4TOTAL, T3FREE, THYROIDAB in the last 72 hours.  Invalid input(s): FREET3   Coagulation profile No results for input(s): INR, PROTIME in the last 168 hours. ------------------------------------------------------------------------------------------------------------------- No results for input(s): DDIMER in the last 72 hours. -------------------------------------------------------------------------------------------------------------------  Cardiac Enzymes  Recent Labs Lab 03/26/16 2357  TROPONINI 0.09*   ------------------------------------------------------------------------------------------------------------------ Invalid input(s): POCBNP  ---------------------------------------------------------------------------------------------------------------  Urinalysis    Component Value Date/Time   COLORURINE YELLOW (A) 03/27/2016 0153   APPEARANCEUR CLEAR (A) 03/27/2016 0153   APPEARANCEUR Clear 01/16/2016 1120   LABSPEC 1.021 03/27/2016 0153   PHURINE 5.0 03/27/2016 0153    GLUCOSEU NEGATIVE 03/27/2016 0153   HGBUR NEGATIVE 03/27/2016 0153   BILIRUBINUR NEGATIVE 03/27/2016 0153   BILIRUBINUR Negative 01/16/2016 1120   KETONESUR NEGATIVE 03/27/2016 0153   PROTEINUR 30 (A) 03/27/2016 0153   NITRITE NEGATIVE 03/27/2016 0153   LEUKOCYTESUR NEGATIVE 03/27/2016 0153   LEUKOCYTESUR Negative 01/16/2016 1120     RADIOLOGY: Dg Chest 2 View  Result Date: 03/27/2016 CLINICAL DATA:  Syncope with slip and fall injury. Brief period of unresponsiveness. EXAM: CHEST  2 VIEW COMPARISON:  11/12/2015 FINDINGS: Postoperative changes in the mediastinum. Slight elevation of right hemidiaphragm. Central interstitial changes and peribronchial thickening consistent with chronic bronchitis. No focal airspace disease or consolidation. No blunting of costophrenic angles. No pneumothorax. Mediastinal contours appear intact. Calcified and tortuous aorta. Degenerative changes in the spine and shoulders. IMPRESSION: Chronic bronchitic changes. No evidence of active pulmonary disease. Electronically Signed   By: Lucienne Capers M.D.   On: 03/27/2016 01:36   Ct Head Wo Contrast  Result Date: 03/27/2016 CLINICAL DATA:  Slip and fall injury. Syncope. Brief period of unresponsiveness. EXAM: CT HEAD  WITHOUT CONTRAST TECHNIQUE: Contiguous axial images were obtained from the base of the skull through the vertex without intravenous contrast. COMPARISON:  None. FINDINGS: Brain: Mild diffuse cerebral atrophy. No ventricular dilatation. Patchy white matter changes consistent with small vessel ischemia. No abnormal extra-axial fluid collections. Gray-white matter junctions are distinct. Basal cisterns are not effaced. Vascular: Atherosclerotic vascular calcifications are present. Skull: Normal. Negative for fracture or focal lesion. Sinuses/Orbits: Opacification and sclerosis in the left mastoid air cells with postoperative change. Right mastoid air cells and paranasal sinuses are clear. Other: None.  IMPRESSION: No acute intracranial abnormalities. Chronic atrophy and small vessel ischemic changes. Electronically Signed   By: Lucienne Capers M.D.   On: 03/27/2016 01:39    EKG: Orders placed or performed during the hospital encounter of 03/27/16  . EKG 12-Lead  . EKG 12-Lead  . ED EKG  . ED EKG    IMPRESSION AND PLAN: 82 year old elderly male patient with history of coronary artery disease, hyperlipidemia, hypertension, atrial flutter, sensorineural deafness presented to the emergency room with fall and passing out and back and thigh pain secondary to arthritis. Admitting diagnosis 1. Syncope 2. Abnormal troponin which could be secondary to demand ischemia 3. Coronary artery disease 4. Hypertension 5. Hyperlipidemia Treatment plan Admit patient to telemetry observation bed Cycle troponin to rule out ischemia and check echocardiogram Aspirin 81 MG orally daily Supportive care.  All the records are reviewed and case discussed with ED provider. Management plans discussed with the patient, family and they are in agreement.  CODE STATUS:FULL Code Status History    This patient does not have a recorded code status. Please follow your organizational policy for patients in this situation.       TOTAL TIME TAKING CARE OF THIS PATIENT: 52 minutes.    Saundra Shelling M.D on 03/27/2016 at 2:59 AM  Between 7am to 6pm - Pager - [phone]  After 6pm go to [email or web address] - password [email or web address] [facility]  Office  [phone]  CC: Primary care physician; Ezequiel Kayser, MD

## 2016-03-27 NOTE — Progress Notes (Signed)
Dr. Ubaldo Glassing made aware of troponin 0.76

## 2016-03-27 NOTE — Care Management (Signed)
Chaplain checked on patient, but patient was not in the room. I predict he is having a procedure done. I will check back later.

## 2016-03-27 NOTE — Care Management Obs Status (Signed)
[facility] NOTIFICATION   Patient Details  Name: Richard Elliott MRN: [id number] Date of Birth: 05-15-1925   Medicare Observation Status Notification Given:  Yes    Shelbie Ammons, RN 03/27/2016, 12:42 PM

## 2016-03-27 NOTE — ED Provider Notes (Signed)
[facility] Emergency Department Provider Note   ____________________________________________   First MD Initiated Contact with Patient 03/27/16 0015     (approximate)  I have reviewed the triage vital signs and the nursing notes.   HISTORY  Chief Complaint Fall    HPI Richard Elliott is a 82 y.o. male with history of atrial flutter, coronary artery disease, mitral stenosis who presents for evaluation of an episode of unresponsiveness and a fall which occurred suddenly just prior to arrival, sudden onset, now resolved, severe, no modifying factors. The patient's son who is at bedside reports that he heard a loud noise and went to check on his bow there, found him on the floor with his eyes open but he would not respond verbally or move anything to command for several minutes. This is never happened to him before. The patient is confused about how he ended up on the floor, he denies any pain complaints. Specifically he denies any headache, no chest pain or difficulty breathing. No recent illness include no vomiting, diarrhea, fevers or chills.   Past Medical History:  Diagnosis Date  . Allergic rhinitis 08/04/2014  . Aortic heart valve narrowing 11/05/2011   Overview:  a.  03/2010:  moderate.  33 mmHg peak grad 24 mmHg mean grad 1.3 cm2    . Arthritis or polyarthritis, rheumatoid ([place]) 11/05/2011  . Atrial flutter ([place]) 04/16/2013   Overview:  10/14 hosp, felt to be secondary to choking episode.  TEE and DC CV.   . Bacterial urinary infection 11/05/2011   Overview:  Urinary tract infections/prostatitis.  Followed by a urologist in [place], [place].   Marland Kitchen Benign fibroma of prostate 11/05/2011   Overview:  a. Status post prostatic biopsy x 2 which have been negative.   . BP (high blood pressure) 11/05/2011  . CAD in native artery 08/04/2014   Overview:  a. Status post PCI of RCA x 2.  b. 4/00 Positive Myoview. Catheterization revealed EF 70% with 3-vessel disease.  c.  11/01/98 CABG x 3 (by Dr. Ronnald Ramp), LIMA to LAD, SVG to RCA, SVG to ramus.  d. 7/00 catheterization, 100% LIMA, patent vein grafts.  e. 7/00 & 9/00 PCI of LAD.  f. 1/01 Redo CABG (by Dr. Corinna Capra), RIMA to distal LAD, SVG to D1, EF 65% at time of cardiac catheterization.     . Chronic otitis externa 11/05/2011  . Compressed spine fracture ([place]) 11/05/2011   Overview:  11/97 Lumbar collapsed vertebrae with impingement.    . Deafness, sensorineural 12/30/2012  . HLD (hyperlipidemia) 11/05/2011   Overview:  a. 2/07 Lipid profile:  TC 239, TG 232, HDL 35, LDL 158.  b. Many intolerances to Lipid medications.   . Mitral stenosis 12/28/2013   Overview:  Moderate per ECHO 10/14 with mean grad 4 and peak grad 54mmHg ALSO mention of mid LV gradient of 58mmHG     Patient Active Problem List   Diagnosis Date Noted  . Syncope 03/27/2016  . Allergic rhinitis 08/04/2014  . CAD in native artery 08/04/2014  . Mitral stenosis 12/28/2013  . Atrial flutter ([place]) 04/16/2013  . Deafness, sensorineural 12/30/2012  . Aortic heart valve narrowing 11/05/2011  . Benign fibroma of prostate 11/05/2011  . Chronic otitis externa 11/05/2011  . Compressed spine fracture ([place]) 11/05/2011  . HLD (hyperlipidemia) 11/05/2011  . BP (high blood pressure) 11/05/2011  . Arthritis or polyarthritis, rheumatoid ([place]) 11/05/2011  . Bacterial urinary infection 11/05/2011  . Compression fracture of vertebral column ([place]) 11/05/2011  .  Rheumatoid arthritis ([place]) 11/05/2011    Past Surgical History:  Procedure Laterality Date  . CARDIAC SURGERY  1991  . PROSTATE SURGERY      Prior to Admission medications   Medication Sig Start Date End Date Taking? Authorizing Provider  albuterol (PROVENTIL HFA;VENTOLIN HFA) 108 (90 Base) MCG/ACT inhaler Inhale 1-2 puffs into the lungs every 6 (six) hours as needed for wheezing or shortness of breath. 11/12/15   Lorin Picket, PA-C  aspirin 81 MG tablet Take by mouth. 08/22/08   Historical Provider, MD    cetirizine (ZYRTEC) 10 MG tablet Take by mouth.    Historical Provider, MD  Cranberry-Vitamin C-Vitamin E (CRANBERRY PLUS VITAMIN C) 4200-20-3 MG-MG-UNIT CAPS Take by mouth. Reported on 01/04/2016    Historical Provider, MD  diphenhydrAMINE (BENADRYL) 25 mg capsule Take by mouth. Reported on 01/16/2016    Historical Provider, MD  ELIQUIS 2.5 MG TABS tablet  04/02/15   Historical Provider, MD  finasteride (PROSCAR) 5 MG tablet Take 1 tablet (5 mg total) by mouth daily. 01/04/16   Larene Beach A McGowan, PA-C  fluticasone (FLONASE) 50 MCG/ACT nasal spray Place into the nose. 11/16/10   Historical Provider, MD  lansoprazole (PREVACID) 15 MG capsule Take by mouth.    Historical Provider, MD  losartan (COZAAR) 50 MG tablet  04/02/15   Historical Provider, MD  magnesium chloride (SLOW-MAG) 64 MG TBEC SR tablet Take by mouth.    Historical Provider, MD  metoprolol tartrate (LOPRESSOR) 25 MG tablet  01/04/15   Historical Provider, MD  montelukast (SINGULAIR) 10 MG tablet  04/02/15   Historical Provider, MD  nitroGLYCERIN (NITROSTAT) 0.4 MG SL tablet Place 1 tablet under tongue as needed for chest pain (may repeat every 5 minutes but seek medical help if pain persists after 3 tablets) as needed 08/22/08   Historical Provider, MD  Omega-3 Fatty Acids (FISH OIL) 1200 MG CAPS Take by mouth.    Historical Provider, MD  senna-docusate (SENOKOT-S) 8.6-50 MG tablet Take by mouth.    Historical Provider, MD  tamsulosin (FLOMAX) 0.4 MG CAPS capsule Take 1 capsule (0.4 mg total) by mouth daily. 01/04/16   Nori Riis, PA-C    Allergies Eggs or egg-derived products; Lisinopril; Atorvastatin; Fenofibrate; Niacin; Simvastatin; Soy allergy; Wheat bran; Aspirin; and Nitrofuran derivatives  Family History  Problem Relation Age of Onset  . Heart failure Mother   . Leukemia Father   . Hypertension Paternal Grandmother   . Heart attack Maternal Grandfather   . Kidney disease Neg Hx   . Prostate cancer Neg Hx     Social  History Social History  Substance Use Topics  . Smoking status: Never Smoker  . Smokeless tobacco: Never Used  . Alcohol use 0.0 oz/week    Review of Systems Constitutional: No fever/chills Eyes: No visual changes. ENT: No sore throat. Cardiovascular: Denies chest pain. Respiratory: Denies shortness of breath. Gastrointestinal: No abdominal pain.  No nausea, no vomiting.  No diarrhea.  No constipation. Genitourinary: Negative for dysuria. Musculoskeletal: Negative for back pain. Skin: Negative for rash. Neurological: Negative for headaches, focal weakness or numbness.  10-point ROS otherwise negative.  ____________________________________________   PHYSICAL EXAM:  VITAL SIGNS: ED Triage Vitals  Enc Vitals Group     BP 03/26/16 2353 128/65     Pulse Rate 03/26/16 2353 70     Resp 03/26/16 2353 18     Temp 03/26/16 2353 97.5 F (36.4 C)     Temp Source 03/26/16 2353 Oral  SpO2 03/26/16 2353 95 %     Weight 03/26/16 2354 179 lb (81.2 kg)     Height 03/26/16 2354 5\' 6"  (1.676 m)     Head Circumference --      Peak Flow --      Pain Score 03/27/16 0020 0     Pain Loc --      Pain Edu? --      Excl. in [place]? --     Constitutional: Alert and oriented x 3. Well appearing and in no acute distress. Eyes: Conjunctivae are normal. PERRL. EOMI. Head: Atraumatic. Nose: No congestion/rhinnorhea. Mouth/Throat: Mucous membranes are moist.  Oropharynx non-erythematous. Neck: No stridor.  No cervical spine tenderness to palpation. Cardiovascular: Normal rate, regular rhythm. +systolic murmur. Good peripheral circulation. Respiratory: Normal respiratory effort.  No retractions. Lungs CTAB. Gastrointestinal: Soft and nontender. No distention. No CVA tenderness. Genitourinary: deferred Musculoskeletal: No lower extremity tenderness nor edema.  No joint effusions. No midline T or L-spine tenderness to palpation. Pelvis is stable to rock and compression. Full painless range of  motion at the hips bilaterally. Neurologic:  Normal speech and language. No gross focal neurologic deficits are appreciated. 5 out of 5 strength in bilateral upper and lower extremities, sensation intact to light touch throughout. Skin:  Skin is warm, dry and intact. No rash noted. Psychiatric: Mood and affect are normal. Speech and behavior are normal.  ____________________________________________   LABS (all labs ordered are listed, but only abnormal results are displayed)  Labs Reviewed  BASIC METABOLIC PANEL - Abnormal; Notable for the following:       Result Value   CO2 21 (*)    Glucose, Bld 136 (*)    BUN 32 (*)    Creatinine, Ser 1.31 (*)    GFR calc non Af Amer 47 (*)    GFR calc Af Amer 54 (*)    All other components within normal limits  CBC - Abnormal; Notable for the following:    RBC 3.70 (*)    Hemoglobin 11.9 (*)    HCT 34.6 (*)    All other components within normal limits  URINALYSIS COMPLETEWITH MICROSCOPIC ([facility] ONLY) - Abnormal; Notable for the following:    Color, Urine YELLOW (*)    APPearance CLEAR (*)    Protein, ur 30 (*)    Squamous Epithelial / LPF 0-5 (*)    All other components within normal limits  TROPONIN I - Abnormal; Notable for the following:    Troponin I 0.09 (*)    All other components within normal limits   ____________________________________________  EKG  ED ECG REPORT I, Joanne Gavel, the attending physician, personally viewed and interpreted this ECG.   Date: 03/27/2016  EKG Time: 23:49  Rate: 71  Rhythm: normal sinus rhythm  Axis: normal  Intervals:none  ST&T Change: Minimal ST depression in lead 1 and aVL. No acute ST elevation.  ____________________________________________  RADIOLOGY  CXR IMPRESSION:  Chronic bronchitic changes. No evidence of active pulmonary disease.     CT head IMPRESSION:  No acute intracranial abnormalities. Chronic atrophy and small  vessel ischemic changes.      ____________________________________________   PROCEDURES  Procedure(s) performed: None  Procedures  Critical Care performed: No  ____________________________________________   INITIAL IMPRESSION / ASSESSMENT AND PLAN / ED COURSE  Pertinent labs & imaging results that were available during my care of the patient were reviewed by me and considered in my medical decision making (see chart for details).  Jerelene Redden  B Lords is a 82 y.o. male with history of atrial flutter, coronary artery disease, mitral stenosis who presents for evaluation of an episode of unresponsiveness and a fall which occurred suddenly just prior to arrival, sudden onset, now resolved, severe, no modifying factors. On exam, he is generally well-appearing and in no acute distress. His vital signs are stable and he is afebrile. He has a benign, atraumatic exam, intact neurological examination. EKG shows minimal ST depression in lead 1 and aVL troponin is mildly elevated at 0.09. Concern for an STEMI versus demand ischemia, possibly cardiogenic syncope given his unresponsive episode. BMP shows mild creatinine elevation of 1.31, we'll give IV fluids as I suspect this may be prerenal. CBC shows mild anemia, hemoglobin 11.9. CT head negative for any acute intracranial process. Chest x-ray shows no acute cardiopulmonary abnormality. Aspirin ordered. Awaiting urinalysis but will discuss case with hospitalist for admission.  Clinical Course     ____________________________________________   FINAL CLINICAL IMPRESSION(S) / ED DIAGNOSES  Final diagnoses:  Syncope, unspecified syncope type  Fall, initial encounter  Elevated troponin      NEW MEDICATIONS STARTED DURING THIS VISIT:  New Prescriptions   No medications on file     Note:  This document was prepared using Dragon voice recognition software and may include unintentional dictation errors.    Joanne Gavel, MD 03/27/16 [phone]

## 2016-03-27 NOTE — Progress Notes (Signed)
[place] [facility] [place] NAME: Richard Elliott    MR#:  [id number]  DATE OF BIRTH:  1924/08/04  SUBJECTIVE:  CHIEF COMPLAINT:   Chief Complaint  Patient presents with  . Fall    Came after a syncopal episode, which pt claims to be mostly due to his back pain, but son found him some what confused after the episode. Troponin rise on monitoring. Pt denies any chest pain now, but had it in past.  REVIEW OF SYSTEMS:  CONSTITUTIONAL: No fever, fatigue or weakness.  EYES: No blurred or double vision.  EARS, NOSE, AND THROAT: No tinnitus or ear pain.  RESPIRATORY: No cough, shortness of breath, wheezing or hemoptysis.  CARDIOVASCULAR: No chest pain, orthopnea, edema.  GASTROINTESTINAL: No nausea, vomiting, diarrhea or abdominal pain.  GENITOURINARY: No dysuria, hematuria.  ENDOCRINE: No polyuria, nocturia,  HEMATOLOGY: No anemia, easy bruising or bleeding SKIN: No rash or lesion. MUSCULOSKELETAL: No joint pain or arthritis.   NEUROLOGIC: No tingling, numbness, weakness.  PSYCHIATRY: No anxiety or depression.   ROS  DRUG ALLERGIES:   Allergies  Allergen Reactions  . Eggs Or Egg-Derived Products Anaphylaxis  . Lisinopril     Other reaction(s): Cough  . Atorvastatin     Other reaction(s): Other (See Comments) Pain to walk, stiffness in joints  . Fenofibrate     Other reaction(s): Other (See Comments) Leg Pain  . Niacin     Other reaction(s): Other (See Comments) Hot flashes  . Simvastatin     Other reaction(s): Other (See Comments) Pain to walk and joint stiffness in legs  . Soy Allergy Other (See Comments)    Other reaction(s): Unknown  . Wheat Bran     Other reaction(s): Unknown Other reaction(s): Unknown  . Aspirin Nausea Only    Other Reaction: GI Upset  . Nitrofuran Derivatives     Other reaction(s): Other (See Comments) Other Reaction: GI UPSET    VITALS:  Blood pressure (!) 144/62, pulse 73, temperature 97.7 F (36.5 C),  temperature source Oral, resp. rate 18, height 5\' 6"  (1.676 m), weight 79.6 kg (175 lb 8 oz), SpO2 94 %.  PHYSICAL EXAMINATION:  GENERAL:  82 y.o.-year-old patient lying in the bed with no acute distress.  EYES: Pupils equal, round, reactive to light and accommodation. No scleral icterus. Extraocular muscles intact.  HEENT: Head atraumatic, normocephalic. Oropharynx and nasopharynx clear.  NECK:  Supple, no jugular venous distention. No thyroid enlargement, no tenderness.  LUNGS: Normal breath sounds bilaterally, no wheezing, rales,rhonchi or crepitation. No use of accessory muscles of respiration.  CARDIOVASCULAR: S1, S2 normal. Positive for murmurs,no rubs, or gallops.  ABDOMEN: Soft, nontender, nondistended. Bowel sounds present. No organomegaly or mass.  EXTREMITIES: No pedal edema, cyanosis, or clubbing.  NEUROLOGIC: Cranial nerves II through XII are intact. Muscle strength 5/5 in all extremities. Sensation intact. Gait not checked.  PSYCHIATRIC: The patient is alert and oriented x 3.  SKIN: No obvious rash, lesion, or ulcer.   Physical Exam LABORATORY PANEL:   CBC  Recent Labs Lab 03/27/16 0555  WBC 7.5  HGB 11.1*  HCT 31.5*  PLT 185   ------------------------------------------------------------------------------------------------------------------  Chemistries   Recent Labs Lab 03/27/16 0555  NA 136  K 4.1  CL 105  CO2 24  GLUCOSE 102*  BUN 27*  CREATININE 1.10  CALCIUM 9.1   ------------------------------------------------------------------------------------------------------------------  Cardiac Enzymes  Recent Labs Lab 03/27/16 0950 03/27/16 1554  TROPONINI 0.59* 0.72*   ------------------------------------------------------------------------------------------------------------------  RADIOLOGY:  Dg Chest 2 View  Result Date: 03/27/2016 CLINICAL DATA:  Syncope with slip and fall injury. Brief period of unresponsiveness. EXAM: CHEST  2 VIEW  COMPARISON:  11/12/2015 FINDINGS: Postoperative changes in the mediastinum. Slight elevation of right hemidiaphragm. Central interstitial changes and peribronchial thickening consistent with chronic bronchitis. No focal airspace disease or consolidation. No blunting of costophrenic angles. No pneumothorax. Mediastinal contours appear intact. Calcified and tortuous aorta. Degenerative changes in the spine and shoulders. IMPRESSION: Chronic bronchitic changes. No evidence of active pulmonary disease. Electronically Signed   By: Lucienne Capers M.D.   On: 03/27/2016 01:36   Ct Head Wo Contrast  Result Date: 03/27/2016 CLINICAL DATA:  Slip and fall injury. Syncope. Brief period of unresponsiveness. EXAM: CT HEAD WITHOUT CONTRAST TECHNIQUE: Contiguous axial images were obtained from the base of the skull through the vertex without intravenous contrast. COMPARISON:  None. FINDINGS: Brain: Mild diffuse cerebral atrophy. No ventricular dilatation. Patchy white matter changes consistent with small vessel ischemia. No abnormal extra-axial fluid collections. Gray-white matter junctions are distinct. Basal cisterns are not effaced. Vascular: Atherosclerotic vascular calcifications are present. Skull: Normal. Negative for fracture or focal lesion. Sinuses/Orbits: Opacification and sclerosis in the left mastoid air cells with postoperative change. Right mastoid air cells and paranasal sinuses are clear. Other: None. IMPRESSION: No acute intracranial abnormalities. Chronic atrophy and small vessel ischemic changes. Electronically Signed   By: Lucienne Capers M.D.   On: 03/27/2016 01:39   US Carotid Bilateral  Result Date: 03/27/2016 CLINICAL DATA:  Syncope, visual disturbance, coronary disease, previous bypass EXAM: BILATERAL CAROTID DUPLEX ULTRASOUND TECHNIQUE: Pearline Cables scale imaging, color Doppler and duplex ultrasound were performed of bilateral carotid and vertebral arteries in the neck. COMPARISON:  None. FINDINGS:  Criteria: Quantification of carotid stenosis is based on velocity parameters that correlate the residual internal carotid diameter with NASCET-based stenosis levels, using the diameter of the distal internal carotid lumen as the denominator for stenosis measurement. The following velocity measurements were obtained: RIGHT ICA:  87/17 cm/sec CCA:  [id number] cm/sec SYSTOLIC ICA/CCA RATIO:  1.2 DIASTOLIC ICA/CCA RATIO:  1.9 ECA:  122 cm/sec LEFT ICA:  111/26 cm/sec CCA:  [id number] cm/sec SYSTOLIC ICA/CCA RATIO:  1.4 DIASTOLIC ICA/CCA RATIO:  1.7 ECA:  159 cm/sec RIGHT CAROTID ARTERY: Minor echogenic shadowing plaque formation. No hemodynamically significant right ICA stenosis, velocity elevation, or turbulent flow. Degree of narrowing less than 50%. RIGHT VERTEBRAL ARTERY:  Antegrade LEFT CAROTID ARTERY: Similar scattered minor echogenic plaque formation. No hemodynamically significant left ICA stenosis, velocity elevation, or turbulent flow. LEFT VERTEBRAL ARTERY:  Antegrade IMPRESSION: Mild bilateral carotid atherosclerosis. No hemodynamically significant ICA stenosis. Degree of narrowing less than 50% bilaterally. Patent antegrade vertebral flow bilaterally . Electronically Signed   By: Jerilynn Mages.  Shick M.D.   On: 03/27/2016 15:00    ASSESSMENT AND PLAN:   Principal Problem:   Syncope  * syncopal episode   Elevated trponin.   On eliquis.   Follows at [facility] cardio.   Have severe AS.    Called cardio consult.    Carotid doppler is negative for significant blockages.    UA negative.  * Hx of CAD * Htn * Hyperlipidemia     Cont home meds.   All the records are reviewed and case discussed with Care Management/Social Workerr. Management plans discussed with the patient, family and they are in agreement.  CODE STATUS: full.  TOTAL TIME TAKING CARE OF THIS PATIENT: 35 minutes.  Pt's son was present in room. Also discussed with  cardiologist on phone,  POSSIBLE D/C IN 1-2 DAYS, DEPENDING ON CLINICAL  CONDITION.   Vaughan Basta M.D on 03/27/2016   Between 7am to 6pm - Pager - [phone]  After 6pm go to [email or web address] - password [email or web address] [facility]  Office  [phone]  CC: Primary care physician; Ezequiel Kayser, MD  Note: This dictation was prepared with Dragon dictation along with smaller phrase technology. Any transcriptional errors that result from this process are unintentional.

## 2016-03-27 NOTE — Progress Notes (Signed)
*  PRELIMINARY RESULTS* Echocardiogram 2D Echocardiogram has been performed.  Richard Elliott 03/27/2016, 12:09 PM

## 2016-03-28 LAB — TROPONIN I: Troponin I: 0.43 ng/mL (ref <0.03)

## 2016-03-28 MED ORDER — GUAIFENESIN ER 600 MG PO TB12
600.0000 mg | ORAL_TABLET | Freq: Two times a day (BID) | ORAL | Status: DC
  Administered 2016-03-28 – 2016-03-29 (×3): 600 mg via ORAL
  Filled 2016-03-28 (×3): qty 1

## 2016-03-28 MED ORDER — LORATADINE 10 MG PO TABS
10.0000 mg | ORAL_TABLET | Freq: Every day | ORAL | Status: DC
  Administered 2016-03-28 – 2016-03-29 (×2): 10 mg via ORAL
  Filled 2016-03-28 (×2): qty 1

## 2016-03-28 NOTE — Progress Notes (Signed)
Discussed with Dr. Ubaldo Glassing.  Not NSTEMI  Can ambulate with PT.

## 2016-03-28 NOTE — Progress Notes (Signed)
[facility]  SUBJECTIVE: chest and general body paini   Vitals:   03/28/16 0303 03/28/16 0305 03/28/16 0541 03/28/16 0601  BP: (!) 206/89 (!) 156/78  (!) 164/69  Pulse: (!) 102 98  81  Resp:    18  Temp:   98.9 F (37.2 C) 97.5 F (36.4 C)  TempSrc:   Oral Oral  SpO2: 95% 94%  96%  Weight:      Height:        Intake/Output Summary (Last 24 hours) at 03/28/16 0842 Last data filed at 03/28/16 [id number]  Gross per 24 hour  Intake             1305 ml  Output                0 ml  Net             1305 ml    LABS: Basic Metabolic Panel:  Recent Labs  03/26/16 2357 03/27/16 0555  NA 135 136  K 3.6 4.1  CL 104 105  CO2 21* 24  GLUCOSE 136* 102*  BUN 32* 27*  CREATININE 1.31* 1.10  CALCIUM 9.5 9.1   Liver Function Tests: No results for input(s): AST, ALT, ALKPHOS, BILITOT, PROT, ALBUMIN in the last 72 hours. No results for input(s): LIPASE, AMYLASE in the last 72 hours. CBC:  Recent Labs  03/26/16 2357 03/27/16 0555  WBC 7.1 7.5  HGB 11.9* 11.1*  HCT 34.6* 31.5*  MCV 93.7 93.5  PLT 206 185   Cardiac Enzymes:  Recent Labs  03/27/16 0950 03/27/16 1554 03/28/16 0450  TROPONINI 0.59* 0.72* 0.43*   BNP: Invalid input(s): POCBNP D-Dimer: No results for input(s): DDIMER in the last 72 hours. Hemoglobin A1C: No results for input(s): HGBA1C in the last 72 hours. Fasting Lipid Panel: No results for input(s): CHOL, HDL, LDLCALC, TRIG, CHOLHDL, LDLDIRECT in the last 72 hours. Thyroid Function Tests: No results for input(s): TSH, T4TOTAL, T3FREE, THYROIDAB in the last 72 hours.  Invalid input(s): FREET3 Anemia Panel: No results for input(s): VITAMINB12, FOLATE, FERRITIN, TIBC, IRON, RETICCTPCT in the last 72 hours.   Physical Exam: Blood pressure (!) 164/69, pulse 81, temperature 97.5 F (36.4 C), temperature source Oral, resp. rate 18, height 5\' 6"  (1.676 m), weight 79.6 kg (175 lb 8 oz), SpO2 96 %.   Wt Readings from  Last 1 Encounters:  03/27/16 79.6 kg (175 lb 8 oz)     General appearance: alert and cooperative Head: Normocephalic, without obvious abnormality, atraumatic Resp: clear to auscultation bilaterally Chest wall: right sided chest wall tenderness, left sided chest wall tenderness Cardio: regular rate and rhythm GI: soft, non-tender; bowel sounds normal; no masses,  no organomegaly Extremities: extremities normal, atraumatic, no cyanosis or edema Neurologic: Grossly normal  TELEMETRY: Reviewed telemetry pt in nsr:  ASSESSMENT AND PLAN:  Principal Problem:   Syncope-not clearly syncope. Appears to be a mechanical fall or secondary to his arthritis. No arrhythmia noted on monitor.  Chest pain-etiology unclear. Mild troponin elevation which does not appear to be an acs event. Pt does have cad and progression of disease may be present but chest pain is atypical and non exertional. He also has moderate to severe as. Have discussed consideration for further evaluation invasively of this for ossible tavr. Pt and his son are not anxious to proceed with this. Will continue current meds and attempt pain control. Pain last pm was neck pain. Trponin are trending down. Continue current regimen.  Teodoro Spray., MD, [facility] 03/28/2016 8:42 AM

## 2016-03-28 NOTE — Progress Notes (Signed)
[facility] [place] NAME: Joel Debusk    MR#:  [id number]  DATE OF BIRTH:  Feb 17, 1925  SUBJECTIVE:  CHIEF COMPLAINT:   Chief Complaint  Patient presents with  . Fall   Presented after a fall. Mostly due to weakness of legs. Had some disorientation per son. Today close to normal. But weak No CP/SOB  REVIEW OF SYSTEMS:  CONSTITUTIONAL: No fever, fatigue or weakness.  EYES: No blurred or double vision.  EARS, NOSE, AND THROAT: No tinnitus or ear pain.  RESPIRATORY: No cough, shortness of breath, wheezing or hemoptysis.  CARDIOVASCULAR: No chest pain, orthopnea, edema.  GASTROINTESTINAL: No nausea, vomiting, diarrhea or abdominal pain.  GENITOURINARY: No dysuria, hematuria.  ENDOCRINE: No polyuria, nocturia,  HEMATOLOGY: No anemia, easy bruising or bleeding SKIN: No rash or lesion. MUSCULOSKELETAL: No joint pain or arthritis.   NEUROLOGIC: No tingling, numbness, weakness.  PSYCHIATRY: No anxiety or depression.   ROS  DRUG ALLERGIES:   Allergies  Allergen Reactions  . Eggs Or Egg-Derived Products Anaphylaxis  . Lisinopril     Other reaction(s): Cough  . Atorvastatin     Other reaction(s): Other (See Comments) Pain to walk, stiffness in joints  . Fenofibrate     Other reaction(s): Other (See Comments) Leg Pain  . Niacin     Other reaction(s): Other (See Comments) Hot flashes  . Simvastatin     Other reaction(s): Other (See Comments) Pain to walk and joint stiffness in legs  . Soy Allergy Other (See Comments)    Other reaction(s): Unknown  . Wheat Bran     Other reaction(s): Unknown Other reaction(s): Unknown  . Aspirin Nausea Only    Other Reaction: GI Upset  . Nitrofuran Derivatives     Other reaction(s): Other (See Comments) Other Reaction: GI UPSET    VITALS:  Blood pressure (!) 160/58, pulse 86, temperature 97.8 F (36.6 C), temperature source Oral, resp. rate 18, height 5\' 6"  (1.676 m), weight 79.6 kg (175  lb 8 oz), SpO2 90 %.  PHYSICAL EXAMINATION:  GENERAL:  82 y.o.-year-old patient lying in the bed with no acute distress.  EYES: Pupils equal, round, reactive to light and accommodation. No scleral icterus. Extraocular muscles intact.  HEENT: Head atraumatic, normocephalic. Oropharynx and nasopharynx clear.  NECK:  Supple, no jugular venous distention. No thyroid enlargement, no tenderness.  LUNGS: Normal breath sounds bilaterally, no wheezing, rales,rhonchi or crepitation. No use of accessory muscles of respiration.  CARDIOVASCULAR: S1, S2 normal. Positive for murmurs,no rubs, or gallops.  ABDOMEN: Soft, nontender, nondistended. Bowel sounds present. No organomegaly or mass.  EXTREMITIES: No pedal edema, cyanosis, or clubbing.  NEUROLOGIC: Cranial nerves II through XII are intact. Muscle strength 5/5 in all extremities. Sensation intact. Gait not checked.  PSYCHIATRIC: The patient is alert and oriented x 3.  SKIN: No obvious rash, lesion, or ulcer.   Physical Exam LABORATORY PANEL:   CBC  Recent Labs Lab 03/27/16 0555  WBC 7.5  HGB 11.1*  HCT 31.5*  PLT 185   ------------------------------------------------------------------------------------------------------------------  Chemistries   Recent Labs Lab 03/27/16 0555  NA 136  K 4.1  CL 105  CO2 24  GLUCOSE 102*  BUN 27*  CREATININE 1.10  CALCIUM 9.1   ------------------------------------------------------------------------------------------------------------------  Cardiac Enzymes  Recent Labs Lab 03/27/16 1554 03/28/16 0450  TROPONINI 0.72* 0.43*   ------------------------------------------------------------------------------------------------------------------  RADIOLOGY:  Dg Chest 2 View  Result Date: 03/27/2016 CLINICAL DATA:  Syncope with slip and fall injury. Brief period  of unresponsiveness. EXAM: CHEST  2 VIEW COMPARISON:  11/12/2015 FINDINGS: Postoperative changes in the mediastinum. Slight elevation  of right hemidiaphragm. Central interstitial changes and peribronchial thickening consistent with chronic bronchitis. No focal airspace disease or consolidation. No blunting of costophrenic angles. No pneumothorax. Mediastinal contours appear intact. Calcified and tortuous aorta. Degenerative changes in the spine and shoulders. IMPRESSION: Chronic bronchitic changes. No evidence of active pulmonary disease. Electronically Signed   By: Lucienne Capers M.D.   On: 03/27/2016 01:36   Ct Head Wo Contrast  Result Date: 03/27/2016 CLINICAL DATA:  Slip and fall injury. Syncope. Brief period of unresponsiveness. EXAM: CT HEAD WITHOUT CONTRAST TECHNIQUE: Contiguous axial images were obtained from the base of the skull through the vertex without intravenous contrast. COMPARISON:  None. FINDINGS: Brain: Mild diffuse cerebral atrophy. No ventricular dilatation. Patchy white matter changes consistent with small vessel ischemia. No abnormal extra-axial fluid collections. Gray-white matter junctions are distinct. Basal cisterns are not effaced. Vascular: Atherosclerotic vascular calcifications are present. Skull: Normal. Negative for fracture or focal lesion. Sinuses/Orbits: Opacification and sclerosis in the left mastoid air cells with postoperative change. Right mastoid air cells and paranasal sinuses are clear. Other: None. IMPRESSION: No acute intracranial abnormalities. Chronic atrophy and small vessel ischemic changes. Electronically Signed   By: Lucienne Capers M.D.   On: 03/27/2016 01:39   US Carotid Bilateral  Result Date: 03/27/2016 CLINICAL DATA:  Syncope, visual disturbance, coronary disease, previous bypass EXAM: BILATERAL CAROTID DUPLEX ULTRASOUND TECHNIQUE: Pearline Cables scale imaging, color Doppler and duplex ultrasound were performed of bilateral carotid and vertebral arteries in the neck. COMPARISON:  None. FINDINGS: Criteria: Quantification of carotid stenosis is based on velocity parameters that correlate the  residual internal carotid diameter with NASCET-based stenosis levels, using the diameter of the distal internal carotid lumen as the denominator for stenosis measurement. The following velocity measurements were obtained: RIGHT ICA:  87/17 cm/sec CCA:  [id number] cm/sec SYSTOLIC ICA/CCA RATIO:  1.2 DIASTOLIC ICA/CCA RATIO:  1.9 ECA:  122 cm/sec LEFT ICA:  111/26 cm/sec CCA:  [id number] cm/sec SYSTOLIC ICA/CCA RATIO:  1.4 DIASTOLIC ICA/CCA RATIO:  1.7 ECA:  159 cm/sec RIGHT CAROTID ARTERY: Minor echogenic shadowing plaque formation. No hemodynamically significant right ICA stenosis, velocity elevation, or turbulent flow. Degree of narrowing less than 50%. RIGHT VERTEBRAL ARTERY:  Antegrade LEFT CAROTID ARTERY: Similar scattered minor echogenic plaque formation. No hemodynamically significant left ICA stenosis, velocity elevation, or turbulent flow. LEFT VERTEBRAL ARTERY:  Antegrade IMPRESSION: Mild bilateral carotid atherosclerosis. No hemodynamically significant ICA stenosis. Degree of narrowing less than 50% bilaterally. Patent antegrade vertebral flow bilaterally . Electronically Signed   By: Jerilynn Mages.  Shick M.D.   On: 03/27/2016 15:00    ASSESSMENT AND PLAN:   Principal Problem:   Syncope  * syncopal episode?   Elevated trponin.   On eliquis.   Follows at [facility] cardio. Discussed with Dr. Ubaldo Glassing. Could be tachy arrythmia. Nothing on tele.   Has severe AS.    Carotid doppler is negative for significant blockages.    UA negative. Needs f/u at [facility]  * Hx of CAD * Htn * Hyperlipidemia     Cont home meds.   All the records are reviewed and case discussed with Care Management/Social Workerr. Management plans discussed with the patient, family and they are in agreement.  CODE STATUS: full.  TOTAL TIME TAKING CARE OF THIS PATIENT: 35 minutes.   Needs PT to see for home safety. Then d/c  POSSIBLE D/C IN 1-2 DAYS, DEPENDING ON CLINICAL  CONDITION.   Hillary Bow R M.D on 03/28/2016   Between 7am to 6pm -  Pager - [phone]  After 6pm go to [email or web address] - password [email or web address] [facility]  Office  [phone]  CC: Primary care physician; Ezequiel Kayser, MD  Note: This dictation was prepared with Dragon dictation along with smaller phrase technology. Any transcriptional errors that result from this process are unintentional.

## 2016-03-28 NOTE — Progress Notes (Signed)
Pt c/o chest pain 10/10 and SOB, HR and BP were elevated as well. Placed pt on 3L of oxygen via [place] and administered a sublingual nitro. New vitals are WNL and patient stated that his pain was"about gone" MD Marcille Blanco made aware. New troponin level was ordered by MD. Will continue to monitor.

## 2016-03-28 NOTE — Progress Notes (Signed)
Pt c/o SOB on shift, O2 saturation at 89%, pt was placed on 3L of oxygen via [place], O2 sats. At 96%. Pt is currently on 2L of oxygen and O2 saturations are at 92-94%. Will continue to monitor.

## 2016-03-28 NOTE — Discharge Instructions (Addendum)
Cardiac diet  Activity as tolerated  You can use overt the counter Mucinex for allergies  Follow up with Dr. Lillia Corporal in a week.

## 2016-03-29 ENCOUNTER — Observation Stay: Payer: Medicare HMO

## 2016-03-29 LAB — GLUCOSE, CAPILLARY: GLUCOSE-CAPILLARY: 127 mg/dL — AB (ref 65–99)

## 2016-03-29 MED ORDER — FUROSEMIDE 40 MG PO TABS: 40.0000 mg | ORAL_TABLET | Freq: Every day | ORAL | 0 refills | Status: DC

## 2016-03-29 MED ORDER — FUROSEMIDE 20 MG PO TABS
60.0000 mg | ORAL_TABLET | Freq: Once | ORAL | Status: AC
  Administered 2016-03-29: 60 mg via ORAL
  Filled 2016-03-29: qty 1

## 2016-03-29 MED ORDER — FUROSEMIDE 10 MG/ML IJ SOLN: 40.0000 mg | Freq: Once | INTRAMUSCULAR | Status: DC

## 2016-03-29 NOTE — Progress Notes (Signed)
Removed telemetry.  Given Rx.  F/U appts made.  Family at bedside.  No questions.  Escorted out of hospital via wheelchair by volunteers.

## 2016-03-29 NOTE — Progress Notes (Signed)
Pt has been impulsive, more disoriented/ confused during shift, reoriented pt several times and provided reassurance. Pt was able to go to sleep around 1 am.

## 2016-03-29 NOTE — Progress Notes (Signed)
Sudini paged with xray results.  Family at bedside and is very adamant about taking patient home asap.

## 2016-03-29 NOTE — Evaluation (Signed)
Physical Therapy Evaluation Patient Details Name: Richard Elliott MRN: [id number] DOB: 07-04-1925 Today's Date: 03/29/2016   History of Present Illness  82 y.o. male with a known history of arthritis, atrial flutter, allergic rhinitis, benign prostatic hypertrophy, coronary artery disease, hyperlipidemia, sensorineural deafness, mitral stenosis presented to the emergency room with fall.  Pt with CT (-) for acute issue.   Clinical Impression  Pt did relatively well with PT and per son is physically not far from his baseline.  He is minimally interactive and is obviously impatient about going home.  He was in the high 90s on O2, but remained in the 90s (dropping as low as 91% post ambulation) t/o the PT session and overall did well.  He showed good balance and though he had a somewhat stooped/hesitant gait has no safety issues.    Follow Up Recommendations Home health PT (pt showed little interested, is near his baseline)    Equipment Recommendations       Recommendations for Other Services       Precautions / Restrictions Precautions Precautions: Fall Restrictions Weight Bearing Restrictions: No      Mobility  Bed Mobility Overal bed mobility: Modified Independent             General bed mobility comments: Pt able to get up to sitting EOB with only light rail use  Transfers Overall transfer level: Independent Equipment used: None             General transfer comment: Pt is able to rise to standing with good confidence and relative safety  Ambulation/Gait Ambulation/Gait assistance: Supervision Ambulation Distance (Feet): 100 Feet Assistive device: None       General Gait Details: Pt with slow but consistent and safe ambulation and son reports he is essentially at his baseline  Stairs            Wheelchair Mobility    Modified Rankin (Stroke Patients Only)       Balance Overall balance assessment: Modified Independent                                            Pertinent Vitals/Pain Pain Assessment: No/denies pain    Home Living Family/patient expects to be discharged to:: Private residence Living Arrangements: Children Available Help at Discharge:  (son is at work t/o the day)   Home Access: Ramped entrance              Prior Function Level of Independence: Independent         Comments: Pt does not get out of the house a lot, but will occasionally drive minimal distances     Hand Dominance        Extremity/Trunk Assessment   Upper Extremity Assessment: Overall WFL for tasks assessed (age appropriate limitations, OA limitations)           Lower Extremity Assessment: Overall WFL for tasks assessed (age appropraite limitations, OA limitations)         Communication   Communication:  (pt minimally interactive)  Cognition Arousal/Alertness: Awake/alert Behavior During Therapy: Flat affect Overall Cognitive Status:  (family reports he is impatient go home)                      General Comments      Exercises     Assessment/Plan    PT Assessment Patient  needs continued PT services  PT Problem List Decreased strength;Decreased activity tolerance;Decreased cognition;Decreased safety awareness          PT Treatment Interventions Gait training;Functional mobility training;Therapeutic activities;Therapeutic exercise;Balance training;Neuromuscular re-education;Patient/family education    PT Goals (Current goals can be found in the Care Plan section)  Acute Rehab PT Goals Patient Stated Goal: go home today PT Goal Formulation: With patient Time For Goal Achievement: 04/12/16 Potential to Achieve Goals: Fair    Frequency Min 2X/week   Barriers to discharge        Co-evaluation               End of Session Equipment Utilized During Treatment: Gait belt Activity Tolerance: Patient limited by fatigue;Patient tolerated treatment well Patient left: in chair       Functional Assessment Tool Used: clinical judgement Functional Limitation: Mobility: Walking and moving around Mobility: Walking and Moving Around Current Status [id number]): At least 20 percent but less than 40 percent impaired, limited or restricted Mobility: Walking and Moving Around Goal Status [id number]): At least 1 percent but less than 20 percent impaired, limited or restricted    Time: 0828-0850 PT Time Calculation (min) (ACUTE ONLY): 22 min   Charges:   PT Evaluation $PT Eval Low Complexity: 1 Procedure     PT G Codes:   PT G-Codes **NOT FOR INPATIENT CLASS** Functional Assessment Tool Used: clinical judgement Functional Limitation: Mobility: Walking and moving around Mobility: Walking and Moving Around Current Status [id number]): At least 20 percent but less than 40 percent impaired, limited or restricted Mobility: Walking and Moving Around Goal Status [id number]): At least 1 percent but less than 20 percent impaired, limited or restricted    Kreg Shropshire, DPT 03/29/2016, 10:32 AM

## 2016-03-29 NOTE — Discharge Summary (Signed)
[facility] [facility] [place] NAME: Richard Elliott    MR#:  [id number]  DATE OF BIRTH:  Mar 17, 1925  DATE OF ADMISSION:  03/27/2016 ADMITTING PHYSICIAN: Saundra Shelling, MD  DATE OF DISCHARGE: 03/29/2016 11:41 AM  PRIMARY CARE PHYSICIAN: Raechel Ache, DAVID, MD   ADMISSION DIAGNOSIS:  Syncope [R55] Elevated troponin [R79.89] Fall, initial encounter [W19.XXXA] Syncope, unspecified syncope type [R55]  DISCHARGE DIAGNOSIS:  Principal Problem:   Syncope   SECONDARY DIAGNOSIS:   Past Medical History:  Diagnosis Date  . Allergic rhinitis 08/04/2014  . Aortic heart valve narrowing 11/05/2011   Overview:  a.  03/2010:  moderate.  33 mmHg peak grad 24 mmHg mean grad 1.3 cm2    . Arthritis or polyarthritis, rheumatoid ([place]) 11/05/2011  . Atrial flutter ([place]) 04/16/2013   Overview:  10/14 hosp, felt to be secondary to choking episode.  TEE and DC CV.   . Bacterial urinary infection 11/05/2011   Overview:  Urinary tract infections/prostatitis.  Followed by a urologist in [place], [place].   Marland Kitchen Benign fibroma of prostate 11/05/2011   Overview:  a. Status post prostatic biopsy x 2 which have been negative.   . BP (high blood pressure) 11/05/2011  . CAD in native artery 08/04/2014   Overview:  a. Status post PCI of RCA x 2.  b. 4/00 Positive Myoview. Catheterization revealed EF 70% with 3-vessel disease.  c. 11/01/98 CABG x 3 (by Dr. Ronnald Ramp), LIMA to LAD, SVG to RCA, SVG to ramus.  d. 7/00 catheterization, 100% LIMA, patent vein grafts.  e. 7/00 & 9/00 PCI of LAD.  f. 1/01 Redo CABG (by Dr. Corinna Capra), RIMA to distal LAD, SVG to D1, EF 65% at time of cardiac catheterization.     . Chronic otitis externa 11/05/2011  . Compressed spine fracture ([place]) 11/05/2011   Overview:  11/97 Lumbar collapsed vertebrae with impingement.    . Deafness, sensorineural 12/30/2012  . HLD (hyperlipidemia) 11/05/2011   Overview:  a. 2/07 Lipid profile:  TC 239, TG 232, HDL 35, LDL 158.  b. Many  intolerances to Lipid medications.   . Mitral stenosis 12/28/2013   Overview:  Moderate per ECHO 10/14 with mean grad 4 and peak grad 43mmHg ALSO mention of mid LV gradient of 71mmHG      ADMITTING HISTORY  HISTORY OF PRESENT ILLNESS: Richard Elliott  is a 82 y.o. male with a known history of arthritis, atrial flutter, allergic rhinitis, benign prostatic hypertrophy, coronary artery disease, hyperlipidemia, sensorineural deafness, mitral stenosis presented to the emergency room with fall. Patient fell down in his living room. He lost balance and had a fall and passed out. No complaints of any chest pain. No history of any shortness of breath, orthopnea or proximal nocturnal dyspnea. Patient is awake and alert and responsive to all verbal commands. Patient is hard of hearing. He complains of pain in the back as well as in the left thigh secondary to arthritis. Patient was worked up in the emergency room with CT head which showed no acute intracranial abnormality. First set of troponin was 0.09 and EKG showed a normal sinus rhythm ST segment depression in lead 1 and aVL. Hospitalist service was consulted for further care of the patient.  HOSPITAL COURSE:   * syncopal episode?   Elevated trponin.   On eliquis.   Follows at [facility] cardio. Discussed with Dr. Ubaldo Glassing. Could be tachy arrythmia. Nothing on tele.   Has severe AS.    Carotid doppler is  negative for significant blockages.    UA negative. Needs f[facility] at [facility]  Seen by physical therapy and home health physical therapy set up.  * Acute on chronic diastolic CHF Chest x-ray showed mild pulmonary edema. Patient is not needing oxygen. Ablated with physical therapy with normal sats.  * Hx of CAD * Htn * Hyperlipidemia     Cont home meds.  Stable for discharge home to follow-up at [facility] with Dr. Lillia Corporal  CONSULTS OBTAINED:  Treatment Team:  Teodoro Spray, MD  DRUG ALLERGIES:   Allergies  Allergen Reactions  . Eggs Or  Egg-Derived Products Anaphylaxis  . Lisinopril     Other reaction(s): Cough  . Atorvastatin     Other reaction(s): Other (See Comments) Pain to walk, stiffness in joints  . Fenofibrate     Other reaction(s): Other (See Comments) Leg Pain  . Niacin     Other reaction(s): Other (See Comments) Hot flashes  . Simvastatin     Other reaction(s): Other (See Comments) Pain to walk and joint stiffness in legs  . Soy Allergy Other (See Comments)    Other reaction(s): Unknown  . Wheat Bran     Other reaction(s): Unknown Other reaction(s): Unknown  . Aspirin Nausea Only    Other Reaction: GI Upset  . Nitrofuran Derivatives     Other reaction(s): Other (See Comments) Other Reaction: GI UPSET    DISCHARGE MEDICATIONS:   Discharge Medication List as of 03/29/2016 11:26 AM    START taking these medications   Details  furosemide (LASIX) 40 MG tablet Take 1 tablet (40 mg total) by mouth daily. 1 tab daily from 03/30/2016 for 3 days. Use as needed after that for leg swelling., Starting Fri 03/29/2016, Normal      CONTINUE these medications which have NOT CHANGED   Details  albuterol (PROVENTIL HFA;VENTOLIN HFA) 108 (90 Base) MCG/ACT inhaler Inhale 1-2 puffs into the lungs every 6 (six) hours as needed for wheezing or shortness of breath., Starting Sun 11/12/2015, Print    aspirin 81 MG tablet Take 81 mg by mouth daily. , Starting Mon 08/22/2008, Historical Med    cetirizine (ZYRTEC) 10 MG tablet Take 10 mg by mouth daily. , Historical Med    Cranberry-Vitamin C-Vitamin E (CRANBERRY PLUS VITAMIN C) 4200-20-3 MG-MG-UNIT CAPS Take 1 tablet by mouth daily. Reported on 01/04/2016, Historical Med    diphenhydrAMINE (BENADRYL) 25 mg capsule Take 25 mg by mouth at bedtime as needed. Reported on 01/16/2016, Historical Med    ELIQUIS 2.5 MG TABS tablet Take 2.5 mg by mouth 2 (two) times daily. , Starting Sun 04/02/2015, Historical Med    finasteride (PROSCAR) 5 MG tablet Take 1 tablet (5 mg total) by  mouth daily., Starting Thu 01/04/2016, Normal    lansoprazole (PREVACID) 15 MG capsule Take 15 mg by mouth daily at 12 noon. , Historical Med    losartan (COZAAR) 50 MG tablet Take 50 mg by mouth daily. , Starting Sun 04/02/2015, Historical Med    magnesium chloride (SLOW-MAG) 64 MG TBEC SR tablet Take 1 tablet by mouth daily. , Historical Med    metoprolol tartrate (LOPRESSOR) 25 MG tablet Take 25 mg by mouth 2 (two) times daily. , Starting Wed 01/04/2015, Historical Med    montelukast (SINGULAIR) 10 MG tablet Take 10 mg by mouth at bedtime. , Starting Sun 04/02/2015, Historical Med    nitroGLYCERIN (NITROSTAT) 0.4 MG SL tablet Place 1 tablet under tongue as needed for chest pain (may  repeat every 5 minutes but seek medical help if pain persists after 3 tablets) as needed, Historical Med    Omega-3 Fatty Acids (FISH OIL) 1200 MG CAPS Take 1 capsule by mouth daily. , Historical Med    senna-docusate (SENOKOT-S) 8.6-50 MG tablet Take 1 tablet by mouth at bedtime as needed. , Historical Med    tamsulosin (FLOMAX) 0.4 MG CAPS capsule Take 1 capsule (0.4 mg total) by mouth daily., Starting Thu 01/04/2016, Print    fluticasone (FLONASE) 50 MCG/ACT nasal spray Place into the nose., Starting Fri 11/16/2010, Historical Med        Today   VITAL SIGNS:  Blood pressure (!) 150/72, pulse 71, temperature 98.1 F (36.7 C), temperature source Oral, resp. rate 18, height 5\' 6"  (1.676 m), weight 79.6 kg (175 lb 8 oz), SpO2 96 %.  I/O:   Intake/Output Summary (Last 24 hours) at 03/29/16 1250 Last data filed at 03/29/16 0953  Gross per 24 hour  Intake              480 ml  Output                0 ml  Net              480 ml    PHYSICAL EXAMINATION:  Physical Exam  GENERAL:  82 y.o.-year-old patient lying in the bed with no acute distress.  LUNGS: Normal breath sounds bilaterally, no wheezing, rales,rhonchi or crepitation. No use of accessory muscles of respiration.  CARDIOVASCULAR: S1, S2 normal.  No murmurs, rubs, or gallops.  ABDOMEN: Soft, non-tender, non-distended. Bowel sounds present. No organomegaly or mass.  NEUROLOGIC: Moves all 4 extremities. PSYCHIATRIC: The patient is alert and awake SKIN: No obvious rash, lesion, or ulcer.   DATA REVIEW:   CBC  Recent Labs Lab 03/27/16 0555  WBC 7.5  HGB 11.1*  HCT 31.5*  PLT 185    Chemistries   Recent Labs Lab 03/27/16 0555  NA 136  K 4.1  CL 105  CO2 24  GLUCOSE 102*  BUN 27*  CREATININE 1.10  CALCIUM 9.1    Cardiac Enzymes  Recent Labs Lab 03/28/16 0450  TROPONINI 0.43*    Microbiology Results  Results for orders placed or performed in visit on 01/16/16  Microscopic Examination     Status: Abnormal   Collection Time: 01/16/16 11:20 AM  Result Value Ref Range Status   WBC, UA 0-5 0 - 5 /hpf Final   RBC, UA 0-2 0 - 2 /hpf Final   Epithelial Cells (non renal) 0-10 0 - 10 /hpf Final   Mucus, UA Present (A) Not Estab. Final   Bacteria, UA None seen None seen/Few Final    RADIOLOGY:  Dg Chest 2 View  Result Date: 03/29/2016 CLINICAL DATA:  82 year old male with weakness. Past medical history includes coronary artery disease and hypertension EXAM: CHEST  2 VIEW COMPARISON:  Prior chest x-ray 03/27/2016 FINDINGS: Cardiac and mediastinal contours are unchanged within normal limits. Atherosclerotic calcifications present in the transverse aorta. Patient is status post median sternotomy with evidence of prior multivessel CABG. Trace pulmonary vascular congestion with slightly increased interstitial prominence. Central airway thickening and peribronchial cuffing is also increased. Developing trace bilateral pleural effusions. No acute osseous abnormality. IMPRESSION: Slightly increased interstitial prominence and developing trace bilateral pleural effusions suggests mild pulmonary interstitial edema and CHF. Aortic Atherosclerosis (ICD10-170.0) Electronically Signed   By: Jacqulynn Cadet M.D.   On: 03/29/2016  09:46   US Carotid  Bilateral  Result Date: 03/27/2016 CLINICAL DATA:  Syncope, visual disturbance, coronary disease, previous bypass EXAM: BILATERAL CAROTID DUPLEX ULTRASOUND TECHNIQUE: Pearline Cables scale imaging, color Doppler and duplex ultrasound were performed of bilateral carotid and vertebral arteries in the neck. COMPARISON:  None. FINDINGS: Criteria: Quantification of carotid stenosis is based on velocity parameters that correlate the residual internal carotid diameter with NASCET-based stenosis levels, using the diameter of the distal internal carotid lumen as the denominator for stenosis measurement. The following velocity measurements were obtained: RIGHT ICA:  87/17 cm/sec CCA:  [id number] cm/sec SYSTOLIC ICA/CCA RATIO:  1.2 DIASTOLIC ICA/CCA RATIO:  1.9 ECA:  122 cm/sec LEFT ICA:  111/26 cm/sec CCA:  [id number] cm/sec SYSTOLIC ICA/CCA RATIO:  1.4 DIASTOLIC ICA/CCA RATIO:  1.7 ECA:  159 cm/sec RIGHT CAROTID ARTERY: Minor echogenic shadowing plaque formation. No hemodynamically significant right ICA stenosis, velocity elevation, or turbulent flow. Degree of narrowing less than 50%. RIGHT VERTEBRAL ARTERY:  Antegrade LEFT CAROTID ARTERY: Similar scattered minor echogenic plaque formation. No hemodynamically significant left ICA stenosis, velocity elevation, or turbulent flow. LEFT VERTEBRAL ARTERY:  Antegrade IMPRESSION: Mild bilateral carotid atherosclerosis. No hemodynamically significant ICA stenosis. Degree of narrowing less than 50% bilaterally. Patent antegrade vertebral flow bilaterally . Electronically Signed   By: Jerilynn Mages.  Shick M.D.   On: 03/27/2016 15:00    Follow up with PCP in 1 week.  Management plans discussed with the patient, family and they are in agreement.  CODE STATUS:     Code Status Orders        Start     Ordered   03/27/16 0350  Full code  Continuous     03/27/16 0349    Code Status History    Date Active Date Inactive Code Status Order ID Comments User Context   03/27/2016  3:49 AM  03/27/2016 10:40 AM Full Code [id number]  Saundra Shelling, MD Inpatient      TOTAL TIME TAKING CARE OF THIS PATIENT ON DAY OF DISCHARGE: more than 30 minutes.   Hillary Bow R M.D on 03/29/2016 at 12:50 PM  Between 7am to 6pm - Pager - [phone]  After 6pm go to [email or web address] - password [email or web address] [facility] [place] [facility]  Office  [phone]  CC: Primary care physician; Ezequiel Kayser, MD  Note: This dictation was prepared with Dragon dictation along with smaller phrase technology. Any transcriptional errors that result from this process are unintentional.

## 2016-04-05 DIAGNOSIS — I6523 Occlusion and stenosis of bilateral carotid arteries: Secondary | ICD-10-CM | POA: Insufficient documentation

## 2016-04-09 DIAGNOSIS — R7302 Impaired glucose tolerance (oral): Secondary | ICD-10-CM | POA: Insufficient documentation

## 2016-05-10 DIAGNOSIS — I200 Unstable angina: Secondary | ICD-10-CM | POA: Insufficient documentation

## 2016-06-12 HISTORY — PX: AORTIC VALVE REPLACEMENT: SHX41

## 2016-06-13 DIAGNOSIS — Z952 Presence of prosthetic heart valve: Secondary | ICD-10-CM | POA: Insufficient documentation

## 2016-06-14 ENCOUNTER — Other Ambulatory Visit: Payer: Medicare HMO

## 2016-06-20 ENCOUNTER — Ambulatory Visit: Payer: Medicare HMO | Admitting: Urology

## 2016-06-21 ENCOUNTER — Ambulatory Visit: Payer: Medicare HMO | Admitting: Urology

## 2016-06-24 ENCOUNTER — Other Ambulatory Visit: Payer: Self-pay

## 2016-06-24 ENCOUNTER — Other Ambulatory Visit: Payer: Medicare HMO

## 2016-06-24 DIAGNOSIS — N401 Enlarged prostate with lower urinary tract symptoms: Secondary | ICD-10-CM

## 2016-06-25 LAB — PSA: Prostate Specific Ag, Serum: 0.2 ng/mL (ref 0.0–4.0)

## 2016-06-27 ENCOUNTER — Ambulatory Visit (INDEPENDENT_AMBULATORY_CARE_PROVIDER_SITE_OTHER): Payer: Medicare HMO | Admitting: Urology

## 2016-06-27 ENCOUNTER — Encounter: Payer: Self-pay | Admitting: Urology

## 2016-06-27 VITALS — BP 136/66 | HR 66 | Ht 66.0 in | Wt 174.6 lb

## 2016-06-27 DIAGNOSIS — N5319 Other ejaculatory dysfunction: Secondary | ICD-10-CM | POA: Diagnosis not present

## 2016-06-27 DIAGNOSIS — C61 Malignant neoplasm of prostate: Secondary | ICD-10-CM

## 2016-06-27 DIAGNOSIS — R339 Retention of urine, unspecified: Secondary | ICD-10-CM | POA: Diagnosis not present

## 2016-06-27 NOTE — Progress Notes (Signed)
11:21 AM   Richard Elliott 1925-04-27 [id number]  Referring provider: Ezequiel Kayser, MD [facility] [place], [place] [address]  Chief Complaint  Patient presents with  . Urinary Retention    HPI: Patient is an 82 year old Caucasian male with a history of urinary retention and prostate cancer who presents today for 6 month follow-up.  Patient is status post cryoablation by Dr. Ernst Spell in 2010.  His last PSA on 10/23/2015 was 0.1.  His urinary symptoms are much improved.  He reports a good urinary flow, minimal hesitancy, no sensation of incomplete bladder emptying. He does state that he experiences some occasional leakage including post void dribbling. He states that he previously was wearing tight fitting underwear and has since switched to boxer briefs which he believes has also significantly improved his urinary leakage. He is doing well on Flomax and finasteride.    Patient is complaining of a penile seepage which he attributes to his semen.  He states this happens when he strains.    His I PSS score today it 10/3.  His previous IPSS score was 14/3.        IPSS    Row Name 06/27/16 1000         International Prostate Symptom Score   How often have you had the sensation of not emptying your bladder? Not at All     How often have you had to urinate less than every two hours? Less than half the time     How often have you found you stopped and started again several times when you urinated? About half the time     How often have you found it difficult to postpone urination? About half the time     How often have you had a weak urinary stream? Less than 1 in 5 times     How often have you had to strain to start urination? Not at All     How many times did you typically get up at night to urinate? 1 Time     Total IPSS Score 10       Quality of Life due to urinary symptoms   If you were to spend the rest of your life with your urinary condition just  the way it is now how would you feel about that? Mixed        Score:  1-7 Mild 8-19 Moderate 20-35 Severe   PMH: Past Medical History:  Diagnosis Date  . Allergic rhinitis 08/04/2014  . Aortic heart valve narrowing 11/05/2011   Overview:  a.  03/2010:  moderate.  33 mmHg peak grad 24 mmHg mean grad 1.3 cm2    . Arthritis or polyarthritis, rheumatoid ([place]) 11/05/2011  . Atrial flutter ([place]) 04/16/2013   Overview:  10/14 hosp, felt to be secondary to choking episode.  TEE and DC CV.   . Bacterial urinary infection 11/05/2011   Overview:  Urinary tract infections/prostatitis.  Followed by a urologist in [place], [place].   Marland Kitchen Benign fibroma of prostate 11/05/2011   Overview:  a. Status post prostatic biopsy x 2 which have been negative.   . BP (high blood pressure) 11/05/2011  . CAD in native artery 08/04/2014   Overview:  a. Status post PCI of RCA x 2.  b. 4/00 Positive Myoview. Catheterization revealed EF 70% with 3-vessel disease.  c. 11/01/98 CABG x 3 (by Dr. Ronnald Ramp), LIMA to LAD, SVG to RCA, SVG to ramus.  d. 7/00  catheterization, 100% LIMA, patent vein grafts.  e. 7/00 & 9/00 PCI of LAD.  f. 1/01 Redo CABG (by Dr. Corinna Capra), RIMA to distal LAD, SVG to D1, EF 65% at time of cardiac catheterization.     . Chronic otitis externa 11/05/2011  . Compressed spine fracture ([place]) 11/05/2011   Overview:  11/97 Lumbar collapsed vertebrae with impingement.    . Deafness, sensorineural 12/30/2012  . HLD (hyperlipidemia) 11/05/2011   Overview:  a. 2/07 Lipid profile:  TC 239, TG 232, HDL 35, LDL 158.  b. Many intolerances to Lipid medications.   . Mitral stenosis 12/28/2013   Overview:  Moderate per ECHO 10/14 with mean grad 4 and peak grad 53mmHg ALSO mention of mid LV gradient of 59mmHG     Surgical History: Past Surgical History:  Procedure Laterality Date  . CARDIAC SURGERY  1991  . PROSTATE SURGERY      Home Medications:  Allergies as of 06/27/2016      Reactions   Eggs Or Egg-derived Products  Anaphylaxis   Lisinopril    Other reaction(s): Cough   Atorvastatin    Other reaction(s): Other (See Comments) Pain to walk, stiffness in joints   Fenofibrate    Other reaction(s): Other (See Comments) Leg Pain   Niacin    Other reaction(s): Other (See Comments) Hot flashes   Simvastatin    Other reaction(s): Other (See Comments) Pain to walk and joint stiffness in legs   Soy Allergy Other (See Comments)   Other reaction(s): Unknown   Wheat Bran    Other reaction(s): Unknown Other reaction(s): Unknown   Aspirin Nausea Only   Other Reaction: GI Upset   Nitrofuran Derivatives    Other reaction(s): Other (See Comments) Other Reaction: GI UPSET      Medication List       Accurate as of 06/27/16 11:21 AM. Always use your most recent med list.          albuterol 108 (90 Base) MCG/ACT inhaler Commonly known as:  PROVENTIL HFA;VENTOLIN HFA Inhale 1-2 puffs into the lungs every 6 (six) hours as needed for wheezing or shortness of breath.   aspirin 81 MG tablet Take 81 mg by mouth daily.   cetirizine 10 MG tablet Commonly known as:  ZYRTEC Take 10 mg by mouth daily.   CRANBERRY PLUS VITAMIN C 4200-20-3 MG-MG-UNIT Caps Generic drug:  Cranberry-Vitamin C-Vitamin E Take 1 tablet by mouth daily. Reported on 01/04/2016   diphenhydrAMINE 25 mg capsule Commonly known as:  BENADRYL Take 25 mg by mouth at bedtime as needed. Reported on 01/16/2016   ELIQUIS 2.5 MG Tabs tablet Generic drug:  apixaban Take 2.5 mg by mouth 2 (two) times daily.   finasteride 5 MG tablet Commonly known as:  PROSCAR Take 1 tablet (5 mg total) by mouth daily.   Fish Oil 1200 MG Caps Take 1 capsule by mouth daily.   fluticasone 50 MCG/ACT nasal spray Commonly known as:  FLONASE Place into the nose.   furosemide 40 MG tablet Commonly known as:  LASIX Take 1 tablet (40 mg total) by mouth daily. 1 tab daily from 03/30/2016 for 3 days. Use as needed after that for leg swelling.   lansoprazole 15  MG capsule Commonly known as:  PREVACID Take 15 mg by mouth daily at 12 noon.   losartan 50 MG tablet Commonly known as:  COZAAR Take 50 mg by mouth daily.   magnesium chloride 64 MG Tbec SR tablet Commonly known as:  SLOW-MAG Take  1 tablet by mouth daily.   metoprolol tartrate 25 MG tablet Commonly known as:  LOPRESSOR Take 25 mg by mouth 2 (two) times daily.   montelukast 10 MG tablet Commonly known as:  SINGULAIR Take 10 mg by mouth at bedtime.   nitroGLYCERIN 0.4 MG SL tablet Commonly known as:  NITROSTAT Place 1 tablet under tongue as needed for chest pain (may repeat every 5 minutes but seek medical help if pain persists after 3 tablets) as needed   senna-docusate 8.6-50 MG tablet Commonly known as:  Senokot-S Take 1 tablet by mouth at bedtime as needed.   tamsulosin 0.4 MG Caps capsule Commonly known as:  FLOMAX Take 1 capsule (0.4 mg total) by mouth daily.       Allergies:  Allergies  Allergen Reactions  . Eggs Or Egg-Derived Products Anaphylaxis  . Lisinopril     Other reaction(s): Cough  . Atorvastatin     Other reaction(s): Other (See Comments) Pain to walk, stiffness in joints  . Fenofibrate     Other reaction(s): Other (See Comments) Leg Pain  . Niacin     Other reaction(s): Other (See Comments) Hot flashes  . Simvastatin     Other reaction(s): Other (See Comments) Pain to walk and joint stiffness in legs  . Soy Allergy Other (See Comments)    Other reaction(s): Unknown  . Wheat Bran     Other reaction(s): Unknown Other reaction(s): Unknown  . Aspirin Nausea Only    Other Reaction: GI Upset  . Nitrofuran Derivatives     Other reaction(s): Other (See Comments) Other Reaction: GI UPSET    Family History: Family History  Problem Relation Age of Onset  . Heart failure Mother   . Leukemia Father   . Hypertension Paternal Grandmother   . Heart attack Maternal Grandfather   . Kidney disease Neg Hx   . Prostate cancer Neg Hx     Social  History:  reports that he has never smoked. He has never used smokeless tobacco. He reports that he drinks alcohol. His drug history is not on file.  ROS: UROLOGY Frequent Urination?: No Hard to postpone urination?: No Burning/pain with urination?: No Get up at night to urinate?: No Leakage of urine?: No Urine stream starts and stops?: No Trouble starting stream?: No Do you have to strain to urinate?: No Blood in urine?: No Urinary tract infection?: No Sexually transmitted disease?: No Injury to kidneys or bladder?: No Painful intercourse?: No Weak stream?: No Erection problems?: No Penile pain?: No  Gastrointestinal Nausea?: No Vomiting?: No Indigestion/heartburn?: No Diarrhea?: No Constipation?: No  Constitutional Fever: No Night sweats?: No Weight loss?: No Fatigue?: No  Skin Skin rash/lesions?: No Itching?: No  Eyes Blurred vision?: No Double vision?: No  Ears/Nose/Throat Sore throat?: No Sinus problems?: No  Hematologic/Lymphatic Swollen glands?: No Easy bruising?: No  Cardiovascular Leg swelling?: No Chest pain?: No  Respiratory Cough?: No Shortness of breath?: No  Endocrine Excessive thirst?: No  Musculoskeletal Back pain?: No Joint pain?: No  Neurological Headaches?: No Dizziness?: No  Psychologic Depression?: No Anxiety?: No  Physical Exam: BP 136/66 (BP Location: Left Arm, Patient Position: Sitting, Cuff Size: Large)   Pulse 66   Ht 5\' 6"  (1.676 m)   Wt 174 lb 9.6 oz (79.2 kg)   BMI 28.18 kg/m   Constitutional: Well nourished. Alert and oriented, No acute distress. HEENT: [place] AT, moist mucus membranes. Trachea midline, no masses. Cardiovascular: No clubbing, cyanosis, or edema. Respiratory: Normal respiratory effort, no  increased work of breathing. GI: Abdomen is soft, non tender, non distended, no abdominal masses. Liver and spleen not palpable.  No hernias appreciated.  Stool sample for occult testing is not indicated.     GU: No CVA tenderness.  No bladder fullness or masses.  Patient with uncircumcised phallus. Foreskin easily retracted  Urethral meatus is patent.  No penile discharge. No penile lesions or rashes. Scrotum without lesions, cysts, rashes and/or edema.  Testicles are located scrotally bilaterally. No masses are appreciated in the testicles. Left and right epididymis are normal. Rectal: Patient with  normal sphincter tone. Anus and perineum without scarring or rashes. No rectal masses are appreciated. Prostate is approximately 45 grams, firm in the right lobe, no nodules are appreciated. Seminal vesicles are normal. Skin: No rashes, bruises or suspicious lesions. Lymph: No cervical or inguinal adenopathy. Neurologic: Grossly intact, no focal deficits, moving all 4 extremities. Psychiatric: Normal mood and affect.  Laboratory Data: PSA History  0.1 ng/mL on 10/23/2015  0.2 ng/mL on 06/24/2016  Assessment & Plan:    1. Urinary retention- Patient reports no further episodes of urinary retention. He reports his symptoms have significantly proved on finasteride and Flomax. He is tolerating the medications well and is satisfied with symptom management. We will refill his prescriptions today.  2. Prostate Cancer- S/p cryoablation by Dr. Madelin Headings 2010.  Last PSA 0.1 ng/mL on 10/23/2015.  PSA nadir 0.3?  Recent PSA is 0.2.  We will see him in 6 months for IPSS, PSA and exam.    3. Ejaculatory disorder:  Patient did not stop his tamsulosin.  This is most likely urine leaking.    Return in about 6 months (around 12/26/2016) for IPSS, PSA and exam.  Zara Council, [facility]  [facility] [address], [place], [place] [address] [phone]

## 2016-08-08 ENCOUNTER — Telehealth: Payer: Self-pay

## 2016-08-08 NOTE — Telephone Encounter (Signed)
Pt called stating he received a phone call from his insurance company saying he was supposed to stop a medication. Nurse was not able to find in the chart where a medication was to be d/c. Did not mention of issues/side effects of flomax. Please advise.

## 2016-08-08 NOTE — Telephone Encounter (Signed)
I do not see a letter in the chart or have I received a letter from his insurance company regarding the discontinuation of a medication.  I would suggest to call the insurance company for clarification.

## 2016-08-09 NOTE — Telephone Encounter (Signed)
Spoke with pt in reference to stopping medication. Made aware insurance company was unaware of what is going on. Reinforced with pt to continue medication. Pt voiced understanding.

## 2016-10-16 ENCOUNTER — Encounter: Payer: Self-pay | Admitting: *Deleted

## 2016-10-16 ENCOUNTER — Encounter: Payer: Self-pay | Admitting: Anesthesiology

## 2016-10-17 ENCOUNTER — Ambulatory Visit: Admit: 2016-10-17 | Payer: Medicare HMO | Admitting: Otolaryngology

## 2016-10-17 HISTORY — DX: Malignant neoplasm of prostate: C61

## 2016-10-17 HISTORY — DX: Unspecified hearing loss, unspecified ear: H91.90

## 2016-10-17 HISTORY — DX: Presence of external hearing-aid: Z97.4

## 2016-10-17 SURGERY — ENDOSCOPY, NOSE
Anesthesia: General | Laterality: Bilateral

## 2016-11-22 ENCOUNTER — Encounter
Admission: RE | Admit: 2016-11-22 | Discharge: 2016-11-22 | Disposition: A | Discharge status: Home / Self Care | Payer: Medicare HMO | Source: Ambulatory Visit | Attending: Otolaryngology | Admitting: Otolaryngology

## 2016-11-22 DIAGNOSIS — Z01812 Encounter for preprocedural laboratory examination: Secondary | ICD-10-CM | POA: Diagnosis present

## 2016-11-22 HISTORY — DX: Headache: R51

## 2016-11-22 HISTORY — DX: Headache, unspecified: R51.9

## 2016-11-22 HISTORY — DX: Gastro-esophageal reflux disease without esophagitis: K21.9

## 2016-11-22 HISTORY — DX: Acute myocardial infarction, unspecified: I21.9

## 2016-11-22 LAB — BASIC METABOLIC PANEL
ANION GAP: 10 (ref 5–15)
BUN: 22 mg/dL — AB (ref 6–20)
CALCIUM: 9.4 mg/dL (ref 8.9–10.3)
CO2: 24 mmol/L (ref 22–32)
CREATININE: 1.01 mg/dL (ref 0.61–1.24)
Chloride: 102 mmol/L (ref 101–111)
GFR calc Af Amer: >60 mL/min (ref >60)
GLUCOSE: 159 mg/dL — AB (ref 65–99)
Potassium: 3.9 mmol/L (ref 3.5–5.1)
Sodium: 136 mmol/L (ref 135–145)

## 2016-11-22 LAB — CBC
HCT: 35 % — ABNORMAL LOW (ref 40.0–52.0)
Hemoglobin: 12.1 g/dL — ABNORMAL LOW (ref 13.0–18.0)
MCH: 32 pg (ref 26.0–34.0)
MCHC: 34.5 g/dL (ref 32.0–36.0)
MCV: 92.8 fL (ref 80.0–100.0)
PLATELETS: 189 10*3/uL (ref 150–440)
RBC: 3.77 MIL/uL — ABNORMAL LOW (ref 4.40–5.90)
RDW: 14.9 % — AB (ref 11.5–14.5)
WBC: 5.8 10*3/uL (ref 3.8–10.6)

## 2016-11-22 NOTE — Patient Instructions (Signed)
  Your procedure is scheduled AX:ENMM 4, 2018. Report to Same Day Surgery. To find out your arrival time please call [phone] between 1PM - 3PM on Friday December 06, 2016.  Remember: Instructions that are not followed completely may result in serious medical risk, up to and including death, or upon the discretion of your surgeon and anesthesiologist your surgery may need to be rescheduled.    _x___ 1. Do not eat food or drink liquids after midnight. No gum chewing or hard candies.     _x___ 2. No Alcohol for 24 hours before or after surgery.   ____ 3. Bring all medications with you on the day of surgery if instructed.    __x__ 4. Notify your doctor if there is any change in your medical condition     (cold, fever, infections).    _____ 5. No smoking 24 hours prior to surgery.     Do not wear jewelry, make-up, hairpins, clips or nail polish.  Do not wear lotions, powders, or perfumes.   Do not shave 48 hours prior to surgery. Men may shave face and neck.  Do not bring valuables to the hospital.    [facility] is not responsible for any belongings or valuables.               Contacts, dentures or bridgework may not be worn into surgery.  Leave your suitcase in the car. After surgery it may be brought to your room.  For patients admitted to the hospital, discharge time is determined by your treatment team.   Patients discharged the day of surgery will not be allowed to drive home.    Please read over the following fact sheets that you were given:   [facility] Preparing for Surgery  _x___ Take these medicines the morning of surgery with A SIP OF WATER:    1. losartan (COZAAR)  2. metoprolol tartrate (LOPRESSOR)  3. montelukast (SINGULAIR)  4. omeprazole (PRILOSEC)   ____ Fleet Enema (as directed)   ____ Use CHG Soap as directed on instruction sheet  ____ Use inhalers on the day of surgery and bring to hospital day of surgery  ____ Stop metformin 2 days prior to  surgery    ____ Take 1/2 of usual insulin dose the night before surgery and none on the morning of surgery.   __x__ Stop ELIQUIS and aspirin as instructed by your cardiologist.  ____ Stop Anti-inflammatories such as Advil, Aleve, Ibuprofen, Motrin, Naproxen,  Naprosyn, Goodies powders or aspirin products. OK to take Tylenol.   __x__ Stop supplements:Biotin, CRANBERRY, Glucosamine HCl  until after surgery.    ____ Bring C-Pap to the hospital.

## 2016-11-22 NOTE — Pre-Procedure Instructions (Addendum)
ECG 12-lead1/02/2017 [place] Component Name Value Ref Range  Vent Rate (bpm) 64   PR Interval (msec) 140   QRS Interval (msec) 110   QT Interval (msec) 396   QTc (msec) 408   Result Narrative  Normal sinus rhythm Intraventricular conduction delay Left ventricular hypertrophy with repolarization abnormalities Anterior ST elevation, probably due to LVH Abnormal ECG  When compared with ECG of 13-Jun-2016 10:22, Left atrial enlargement is now absent I reviewed and concur with this report. Electronically signed BR:[id number], MD, CHRISTOPHER ([id number]) on 07/15/2016 5:34:13 PM   See cardiac clearance on front of chart.

## 2016-12-05 ENCOUNTER — Other Ambulatory Visit: Payer: Self-pay

## 2016-12-05 DIAGNOSIS — C61 Malignant neoplasm of prostate: Secondary | ICD-10-CM

## 2016-12-09 ENCOUNTER — Ambulatory Visit
Admission: RE | Admit: 2016-12-09 | Discharge: 2016-12-09 | Disposition: A | Discharge status: Home / Self Care | Payer: Medicare HMO | Source: Ambulatory Visit | Attending: Otolaryngology | Admitting: Otolaryngology

## 2016-12-09 ENCOUNTER — Ambulatory Visit: Payer: Medicare HMO | Admitting: Certified Registered"

## 2016-12-09 ENCOUNTER — Encounter
Admission: RE | Disposition: A | Discharge status: Home / Self Care | Payer: Self-pay | Source: Ambulatory Visit | Attending: Otolaryngology

## 2016-12-09 ENCOUNTER — Encounter: Payer: Self-pay | Admitting: *Deleted

## 2016-12-09 DIAGNOSIS — I2510 Atherosclerotic heart disease of native coronary artery without angina pectoris: Secondary | ICD-10-CM | POA: Insufficient documentation

## 2016-12-09 DIAGNOSIS — Z79899 Other long term (current) drug therapy: Secondary | ICD-10-CM | POA: Diagnosis not present

## 2016-12-09 DIAGNOSIS — H6992 Unspecified Eustachian tube disorder, left ear: Secondary | ICD-10-CM | POA: Insufficient documentation

## 2016-12-09 DIAGNOSIS — J342 Deviated nasal septum: Secondary | ICD-10-CM | POA: Diagnosis not present

## 2016-12-09 DIAGNOSIS — J310 Chronic rhinitis: Secondary | ICD-10-CM | POA: Diagnosis not present

## 2016-12-09 DIAGNOSIS — Z7951 Long term (current) use of inhaled steroids: Secondary | ICD-10-CM | POA: Insufficient documentation

## 2016-12-09 DIAGNOSIS — Z9889 Other specified postprocedural states: Secondary | ICD-10-CM | POA: Diagnosis not present

## 2016-12-09 DIAGNOSIS — H7292 Unspecified perforation of tympanic membrane, left ear: Secondary | ICD-10-CM | POA: Diagnosis present

## 2016-12-09 DIAGNOSIS — Z7982 Long term (current) use of aspirin: Secondary | ICD-10-CM | POA: Insufficient documentation

## 2016-12-09 DIAGNOSIS — M779 Enthesopathy, unspecified: Secondary | ICD-10-CM | POA: Diagnosis not present

## 2016-12-09 DIAGNOSIS — J329 Chronic sinusitis, unspecified: Secondary | ICD-10-CM | POA: Insufficient documentation

## 2016-12-09 DIAGNOSIS — Z951 Presence of aortocoronary bypass graft: Secondary | ICD-10-CM | POA: Insufficient documentation

## 2016-12-09 DIAGNOSIS — M1990 Unspecified osteoarthritis, unspecified site: Secondary | ICD-10-CM | POA: Insufficient documentation

## 2016-12-09 HISTORY — PX: NASAL ENDOSCOPY: SHX6577

## 2016-12-09 HISTORY — PX: BALLOON DILATION: SHX5330

## 2016-12-09 SURGERY — BALLOON DILATION
Anesthesia: General | Laterality: Left

## 2016-12-09 MED ORDER — GLYCOPYRROLATE 0.2 MG/ML IJ SOLN
INTRAMUSCULAR | Status: AC
  Filled 2016-12-09: qty 1

## 2016-12-09 MED ORDER — ONDANSETRON HCL 4 MG/2ML IJ SOLN
INTRAMUSCULAR | Status: AC
  Filled 2016-12-09: qty 2

## 2016-12-09 MED ORDER — ONDANSETRON HCL 4 MG/2ML IJ SOLN: 4.0000 mg | Freq: Once | INTRAMUSCULAR | Status: DC | PRN

## 2016-12-09 MED ORDER — LIDOCAINE HCL (PF) 2 % IJ SOLN
INTRAMUSCULAR | Status: AC
  Filled 2016-12-09: qty 2

## 2016-12-09 MED ORDER — FENTANYL CITRATE (PF) 100 MCG/2ML IJ SOLN: 25.0000 ug | INTRAMUSCULAR | Status: DC | PRN

## 2016-12-09 MED ORDER — LACTATED RINGERS IV SOLN
INTRAVENOUS | Status: DC
  Administered 2016-12-09 (×2): via INTRAVENOUS

## 2016-12-09 MED ORDER — PHENYLEPHRINE HCL 10 % OP SOLN
Freq: Once | OPHTHALMIC | Status: DC
  Filled 2016-12-09: qty 10

## 2016-12-09 MED ORDER — EPHEDRINE SULFATE 50 MG/ML IJ SOLN
INTRAMUSCULAR | Status: DC | PRN
  Administered 2016-12-09 (×2): 10 mg via INTRAVENOUS

## 2016-12-09 MED ORDER — BACITRACIN ZINC 500 UNIT/GM EX OINT
TOPICAL_OINTMENT | CUTANEOUS | Status: AC
  Filled 2016-12-09: qty 28.35

## 2016-12-09 MED ORDER — PROPOFOL 10 MG/ML IV BOLUS
INTRAVENOUS | Status: AC
  Filled 2016-12-09: qty 20

## 2016-12-09 MED ORDER — FENTANYL CITRATE (PF) 100 MCG/2ML IJ SOLN
INTRAMUSCULAR | Status: AC
  Filled 2016-12-09: qty 2

## 2016-12-09 MED ORDER — FENTANYL CITRATE (PF) 100 MCG/2ML IJ SOLN
INTRAMUSCULAR | Status: DC | PRN
  Administered 2016-12-09: 50 ug via INTRAVENOUS
  Administered 2016-12-09: 25 ug via INTRAVENOUS

## 2016-12-09 MED ORDER — PHENYLEPHRINE HCL 10 MG/ML IJ SOLN
INTRAMUSCULAR | Status: DC | PRN
  Administered 2016-12-09: 100 ug via INTRAVENOUS

## 2016-12-09 MED ORDER — SUCCINYLCHOLINE CHLORIDE 20 MG/ML IJ SOLN
INTRAMUSCULAR | Status: AC
  Filled 2016-12-09: qty 1

## 2016-12-09 MED ORDER — OXYMETAZOLINE HCL 0.05 % NA SOLN
NASAL | Status: AC
  Filled 2016-12-09: qty 15

## 2016-12-09 MED ORDER — ONDANSETRON HCL 4 MG/2ML IJ SOLN
INTRAMUSCULAR | Status: DC | PRN
  Administered 2016-12-09: 4 mg via INTRAVENOUS

## 2016-12-09 MED ORDER — SUCCINYLCHOLINE CHLORIDE 20 MG/ML IJ SOLN
INTRAMUSCULAR | Status: DC | PRN
  Administered 2016-12-09: 100 mg via INTRAVENOUS

## 2016-12-09 MED ORDER — OXYMETAZOLINE HCL 0.05 % NA SOLN
NASAL | Status: DC | PRN
Start: 2016-12-09 — End: 2016-12-09
  Administered 2016-12-09: 1

## 2016-12-09 MED ORDER — PROPOFOL 10 MG/ML IV BOLUS
INTRAVENOUS | Status: DC | PRN
  Administered 2016-12-09: 20 mg via INTRAVENOUS
  Administered 2016-12-09: 120 mg via INTRAVENOUS

## 2016-12-09 MED ORDER — LIDOCAINE-EPINEPHRINE (PF) 1 %-1:200000 IJ SOLN
INTRAMUSCULAR | Status: AC
  Filled 2016-12-09: qty 30

## 2016-12-09 MED ORDER — GLYCOPYRROLATE 0.2 MG/ML IJ SOLN
INTRAMUSCULAR | Status: DC | PRN
  Administered 2016-12-09: 0.1 mg via INTRAVENOUS

## 2016-12-09 MED ORDER — LIDOCAINE HCL (CARDIAC) 20 MG/ML IV SOLN
INTRAVENOUS | Status: DC | PRN
  Administered 2016-12-09: 100 mg via INTRAVENOUS

## 2016-12-09 MED ORDER — EPHEDRINE SULFATE 50 MG/ML IJ SOLN
INTRAMUSCULAR | Status: AC
  Filled 2016-12-09: qty 1

## 2016-12-09 MED ORDER — ROCURONIUM BROMIDE 50 MG/5ML IV SOLN
INTRAVENOUS | Status: AC
  Filled 2016-12-09: qty 1

## 2016-12-09 MED ORDER — DEXAMETHASONE SODIUM PHOSPHATE 10 MG/ML IJ SOLN
INTRAMUSCULAR | Status: AC
  Filled 2016-12-09: qty 1

## 2016-12-09 MED ORDER — DEXAMETHASONE SODIUM PHOSPHATE 10 MG/ML IJ SOLN
INTRAMUSCULAR | Status: DC | PRN
  Administered 2016-12-09: 5 mg via INTRAVENOUS

## 2016-12-09 MED ORDER — GELATIN ABSORBABLE 12-7 MM EX MISC
CUTANEOUS | Status: AC
  Filled 2016-12-09: qty 1

## 2016-12-09 MED ORDER — PHENYLEPHRINE HCL 10 MG/ML IJ SOLN
INTRAMUSCULAR | Status: AC
  Filled 2016-12-09: qty 1

## 2016-12-09 MED ORDER — LIDOCAINE-EPINEPHRINE 1 %-1:100000 IJ SOLN
INTRAMUSCULAR | Status: AC
  Filled 2016-12-09: qty 1

## 2016-12-09 SURGICAL SUPPLY — 51 items
ADHESIVE MASTISOL STRL (MISCELLANEOUS) IMPLANT
BALLN CATH EUST TUBE 6X16 (BALLOONS) ×4
BATTERY INSTRU NAVIGATION (MISCELLANEOUS) IMPLANT
BLADE SURG 15 STRL LF DISP TIS (BLADE) ×2 IMPLANT
BLADE SURG 15 STRL SS (BLADE) ×2
CANISTER SUCT 1200ML W/VALVE (MISCELLANEOUS) ×4 IMPLANT
CANISTER SUCT 3000ML PPV (MISCELLANEOUS) IMPLANT
CATH BALLOON EUST TUBE 6X16 (BALLOONS) ×2 IMPLANT
CATH IV ANGIO 16GX3.25 GREY (CATHETERS) IMPLANT
COAG SUCT 10F 3.5MM HAND CTRL (MISCELLANEOUS) ×4 IMPLANT
COTTON BALL STRL MEDIUM (GAUZE/BANDAGES/DRESSINGS) IMPLANT
CUP MEDICINE 2OZ PLAST GRAD ST (MISCELLANEOUS) ×4 IMPLANT
DEVICE INFLATION 20/61 (MISCELLANEOUS) ×4 IMPLANT
DRAPE MICROSCOPE ZEISS 48X118 (DRAPES) IMPLANT
DRAPE SURG 17X11 SM STRL (DRAPES) ×4 IMPLANT
DRESSING NASL FOAM PST OP SINU (MISCELLANEOUS) IMPLANT
DRSG NASAL FOAM POST OP SINU (MISCELLANEOUS)
ELECT REM PT RETURN 9FT ADLT (ELECTROSURGICAL) ×4
ELECTRODE REM PT RTRN 9FT ADLT (ELECTROSURGICAL) ×2 IMPLANT
GLOVE BIO SURGEON STRL SZ7.5 (GLOVE) ×4 IMPLANT
GLOVE BIO SURGEON STRL SZ8 (GLOVE) IMPLANT
GLOVE PROTEXIS LATEX SZ 7.5 (GLOVE) ×8 IMPLANT
GOWN STRL REUS W/ TWL LRG LVL3 (GOWN DISPOSABLE) ×4 IMPLANT
GOWN STRL REUS W/TWL LRG LVL3 (GOWN DISPOSABLE) ×4
IV CATH ANGIO 16GX3.25 GREY (CATHETERS)
IV NS 500ML (IV SOLUTION)
IV NS 500ML BAXH (IV SOLUTION) IMPLANT
KIT RM TURNOVER STRD PROC AR (KITS) ×4 IMPLANT
NAVIGATION MASK REG  ST (MISCELLANEOUS) IMPLANT
NEEDLE FILTER BLUNT 18X 1/2SAF (NEEDLE) ×2
NEEDLE FILTER BLUNT 18X1 1/2 (NEEDLE) ×2 IMPLANT
NEEDLE SPNL 25GX3.5 QUINCKE BL (NEEDLE) IMPLANT
NS IRRIG 500ML POUR BTL (IV SOLUTION) ×4 IMPLANT
PACK HEAD/NECK (MISCELLANEOUS) ×4 IMPLANT
PACKING NASAL EPIS 4X2.4 XEROG (MISCELLANEOUS) IMPLANT
PATTIES SURGICAL .5 X3 (DISPOSABLE) ×4 IMPLANT
SHAVER DIEGO BLD STD TYPE A (BLADE) IMPLANT
SLEEVE PROTECTION STRL DISP (MISCELLANEOUS) ×4 IMPLANT
SOL ANTI-FOG 6CC FOG-OUT (MISCELLANEOUS) ×2 IMPLANT
SOL FOG-OUT ANTI-FOG 6CC (MISCELLANEOUS) ×2
SPOGE SURGIFLO 8M (HEMOSTASIS)
SPONGE SURGIFLO 8M (HEMOSTASIS) IMPLANT
STAPLER SKIN PROX 35W (STAPLE) IMPLANT
SUT PLAIN GUT 4-0 (SUTURE) IMPLANT
SUT PLAIN GUT FAST 5-0 (SUTURE) IMPLANT
SWAB CULTURE AMIES ANAERIB BLU (MISCELLANEOUS) IMPLANT
SYR 20CC LL (SYRINGE) ×4 IMPLANT
SYR 3ML LL SCALE MARK (SYRINGE) ×4 IMPLANT
TUBING DECLOG MULTIDEBRIDER (TUBING) IMPLANT
WATER STERILE IRR 1000ML POUR (IV SOLUTION) ×4 IMPLANT
WIPE NON LINTING 3.25X3.25 (MISCELLANEOUS) IMPLANT

## 2016-12-09 NOTE — Anesthesia Post-op Follow-up Note (Cosign Needed)
Anesthesia QCDR form completed.        

## 2016-12-09 NOTE — Op Note (Signed)
12/09/2016  8:12 AM    Richard Elliott  [id number]   Pre-Op Dx:  Verdie Drown tube dysfunction  Post-op Dx: Eustachian tube dysfunction, left TM perforation  Proc: Diagnostic nasal endoscopy, balloon dilation of the left eustachian tube, examined left ear under anesthesia   Surg:  Muaad Boehning H  Anes:  GOT  EBL:  Minimal  Comp:  None  Findings:  The patient noted he had a small amount of drainage is morning from his ear. The left ear canal was visualized under anesthesia and a small anterior inferior perforation was noted with clear mucoid drainage suctioned clear.  Patient had a very deviated septum to the left side. I was able to get the balloon dilator under the septal spur on the left side and use a 70 scope on the right side visualize the area.  Procedure: The patient was given general anesthesia by oral endotracheal intubation. The left ear canal was visualized under high-power microscope and a small amount suction was used to suctions mucus at the anterior portion of the eardrum. Once the dried mucus was removed there was some thick clear mucus underneath it and a small 3 mm hole was noted in the anterior inferior quadrant. The eardrum was retracted and there is no redness or signs of active infection.  The nose was sprayed with Afrin and the 0 scope was used to visualize nasal passages. The left side had a very deviated septum with the spur pushing into the inferior turbinate. I was able place a small 0 scope inferior to the spur to see into the nasopharynx. He had a small opening to is eustachian tube. I was unable to get the scope and the balloon through together as there was not enough room. I placed the scope through the right side and he had enlarged right inferior turbinate. I outfractured this to visualize things better. A 70 scope was then used to visualize the eustachian tube left side through the right nostril. The aero balloon was then placed through the bottom of the  left nostril to the nasopharyngeal area. It was placed into the eustachian tube and care was taken to created all the way to the full extent of the tube. Once it was deployed properly the balloon was dilated to 12 cm of pressure. This was allowed to sit for 2 full minutes. The balloon was then deflated and the balloon was removed. There was no significant bleeding noted at all.  The patient tolerated the procedure well. He was awakened and taken to the recovery room in satisfactory condition.  Dispo:   To PACU to be discharged home  Plan:  To follow-up in the office in a couple weeks to make sure he is doing well. I will give him some Ciprodex to use for the next several days to make sure the drainage in his left ear has settle down. He knows not get water in his ear.  Arin Peral H  12/09/2016 8:12 AM

## 2016-12-09 NOTE — Transfer of Care (Signed)
Immediate Anesthesia Transfer of Care Note  Patient: Richard Elliott  Procedure(s) Performed: Procedure(s): BALLOON DILATION OF LEFT EUSTACHIAN TUBE (Left) NASAL ENDOSCOPY (Bilateral)  Patient Location: PACU  Anesthesia Type:General  Level of Consciousness: drowsy and patient cooperative  Airway & Oxygen Therapy: Patient Spontanous Breathing and Patient connected to face mask oxygen  Post-op Assessment: Report given to RN, Post -op Vital signs reviewed and stable and Patient moving all extremities X 4  Post vital signs: Reviewed and stable  Last Vitals:  Vitals:   12/09/16 0610 12/09/16 0823  BP: (!) 171/64 (!) 164/54  Pulse: 69 74  Resp: 18 20  Temp: 36.4 C 36.6 C    Last Pain:  Vitals:   12/09/16 0610  TempSrc: Tympanic         Complications: No apparent anesthesia complications

## 2016-12-09 NOTE — Discharge Instructions (Signed)
General Anesthesia, Adult, Care After °These instructions provide you with information about caring for yourself after your procedure. Your health care provider may also give you more specific instructions. Your treatment has been planned according to current medical practices, but problems sometimes occur. Call your health care provider if you have any problems or questions after your procedure. °What can I expect after the procedure? °After the procedure, it is common to have: °· Vomiting. °· A sore throat. °· Mental slowness. ° °It is common to feel: °· Nauseous. °· Cold or shivery. °· Sleepy. °· Tired. °· Sore or achy, even in parts of your body where you did not have surgery. ° °Follow these instructions at home: °For at least 24 hours after the procedure: °· Do not: °? Participate in activities where you could fall or become injured. °? Drive. °? Use heavy machinery. °? Drink alcohol. °? Take sleeping pills or medicines that cause drowsiness. °? Make important decisions or sign legal documents. °? Take care of children on your own. °· Rest. °Eating and drinking °· If you vomit, drink water, juice, or soup when you can drink without vomiting. °· Drink enough fluid to keep your urine clear or pale yellow. °· Make sure you have little or no nausea before eating solid foods. °· Follow the diet recommended by your health care provider. °General instructions °· Have a responsible adult stay with you until you are awake and alert. °· Return to your normal activities as told by your health care provider. Ask your health care provider what activities are safe for you. °· Take over-the-counter and prescription medicines only as told by your health care provider. °· If you smoke, do not smoke without supervision. °· Keep all follow-up visits as told by your health care provider. This is important. °Contact a health care provider if: °· You continue to have nausea or vomiting at home, and medicines are not helpful. °· You  cannot drink fluids or start eating again. °· You cannot urinate after 8-12 hours. °· You develop a skin rash. °· You have fever. °· You have increasing redness at the site of your procedure. °Get help right away if: °· You have difficulty breathing. °· You have chest pain. °· You have unexpected bleeding. °· You feel that you are having a life-threatening or urgent problem. °This information is not intended to replace advice given to you by your health care provider. Make sure you discuss any questions you have with your health care provider. °Document Released: 09/30/2000 Document Revised: 11/27/2015 Document Reviewed: 06/08/2015 °Elsevier Interactive Patient Education © 2018 [employer] ° °

## 2016-12-09 NOTE — Anesthesia Preprocedure Evaluation (Signed)
Anesthesia Evaluation  Patient identified by MRN, date of birth, ID band Patient awake    Reviewed: Allergy & Precautions, H&P , NPO status , Patient's Chart, lab work & pertinent test results, reviewed documented beta blocker date and time   History of Anesthesia Complications Negative for: history of anesthetic complications  Airway Mallampati: III  TM Distance: >3 FB Neck ROM: full    Dental  (+) Teeth Intact, Dental Advidsory Given   Pulmonary neg pulmonary ROS,           Cardiovascular Exercise Tolerance: Good hypertension, (-) angina+ CAD, + Past MI, + CABG and + Peripheral Vascular Disease  (-) Cardiac Stents (-) dysrhythmias + Valvular Problems/Murmurs (s/p AVR) AS      Neuro/Psych negative neurological ROS  negative psych ROS   GI/Hepatic Neg liver ROS, GERD  ,  Endo/Other  negative endocrine ROS  Renal/GU negative Renal ROS  negative genitourinary   Musculoskeletal   Abdominal   Peds  Hematology negative hematology ROS (+)   Anesthesia Other Findings Past Medical History: 08/04/2014: Allergic rhinitis 11/05/2011: Aortic heart valve narrowing     Comment: Overview:  a.  03/2010:  moderate.  33 mmHg               peak grad 24 mmHg mean grad 1.3 cm2   11/05/2011: Arthritis or polyarthritis, rheumatoid (HCC)     Comment: wrist, hands, hips, knees 04/16/2013: Atrial flutter ([place])     Comment: Overview:  10/14 hosp, felt to be secondary to              choking episode.  TEE and DC CV.  11/05/2011: Bacterial urinary infection     Comment: Overview:  Urinary tract               infections/prostatitis.  Followed by a               urologist in [place], [place].  11/05/2011: Benign fibroma of prostate     Comment: Overview:  a. Status post prostatic biopsy x 2              which have been negative.  11/05/2011: BP (high blood pressure) 08/04/2014: CAD in native artery     Comment: Overview:  a. Status post PCI of RCA x  2.  b.               4/00 Positive Myoview. Catheterization revealed              EF 70% with 3-vessel disease.  c. 11/01/98 CABG               x 3 (by Dr. Ronnald Ramp), LIMA to LAD, SVG to RCA,               SVG to ramus.  d. 7/00 catheterization, 100%               LIMA, patent vein grafts.  e. 7/00 & 9/00 PCI               of LAD.  f. 1/01 Redo CABG (by Dr. Corinna Capra), RIMA               to distal LAD, SVG to D1, EF 65% at time of               cardiac catheterization.    11/05/2011: Chronic otitis externa 11/05/2011: Compressed spine fracture ([place])     Comment: Overview:  11/97 Lumbar collapsed vertebrae  with impingement.   12/30/2012: Deafness, sensorineural No date: GERD (gastroesophageal reflux disease) No date: Headache     Comment: enviornmental allergies No date: Hearing aid worn     Comment: bilateral 11/05/2011: HLD (hyperlipidemia)     Comment: Overview:  a. 2/07 Lipid profile:  TC 239, TG               232, HDL 35, LDL 158.  b. Many intolerances to               Lipid medications.  No date: HOH (hard of hearing) 12/28/2013: Mitral stenosis     Comment: Overview:  Moderate per ECHO 10/14 with mean               grad 4 and peak grad 32mmHg ALSO mention of mid               LV gradient of 68mmHG  1995 & 2017: Myocardial infarction ([place]) No date: Prostate cancer ([place])   Reproductive/Obstetrics negative OB ROS                             Anesthesia Physical Anesthesia Plan  ASA: III  Anesthesia Plan: General   Post-op Pain Management:    Induction:   Airway Management Planned:   Additional Equipment:   Intra-op Plan:   Post-operative Plan:   Informed Consent: I have reviewed the patients History and Physical, chart, labs and discussed the procedure including the risks, benefits and alternatives for the proposed anesthesia with the patient or authorized representative who has indicated his/her understanding and acceptance.   Dental  Advisory Given  Plan Discussed with: Anesthesiologist, CRNA and Surgeon  Anesthesia Plan Comments:         Anesthesia Quick Evaluation

## 2016-12-09 NOTE — Anesthesia Procedure Notes (Signed)
Procedure Name: Intubation Date/Time: 12/09/2016 7:38 AM Performed by: Silvana Newness Pre-anesthesia Checklist: Patient identified, Emergency Drugs available, Suction available, Patient being monitored and Timeout performed Patient Re-evaluated:Patient Re-evaluated prior to inductionOxygen Delivery Method: Circle system utilized Preoxygenation: Pre-oxygenation with 100% oxygen Intubation Type: IV induction Ventilation: Mask ventilation without difficulty Laryngoscope Size: Mac and 4 Grade View: Grade II Tube type: Oral Rae Tube size: 7.0 mm Number of attempts: 1 Airway Equipment and Method: Stylet Placement Confirmation: ETT inserted through vocal cords under direct vision,  positive ETCO2 and breath sounds checked- equal and bilateral Tube secured with: Tape Dental Injury: Teeth and Oropharynx as per pre-operative assessment

## 2016-12-09 NOTE — H&P (Signed)
H&P has been reviewed and pt reevaluated, and no changes necessary. To be downloaded later.  

## 2016-12-10 NOTE — Anesthesia Postprocedure Evaluation (Signed)
Anesthesia Post Note  Patient: Richard Elliott  Procedure(s) Performed: Procedure(s) (LRB): BALLOON DILATION OF LEFT EUSTACHIAN TUBE (Left) NASAL ENDOSCOPY (Bilateral)  Patient location during evaluation: PACU Anesthesia Type: General Level of consciousness: awake and alert Pain management: pain level controlled Vital Signs Assessment: post-procedure vital signs reviewed and stable Respiratory status: spontaneous breathing, nonlabored ventilation, respiratory function stable and patient connected to nasal cannula oxygen Cardiovascular status: blood pressure returned to baseline and stable Postop Assessment: no signs of nausea or vomiting Anesthetic complications: no     Last Vitals:  Vitals:   12/09/16 0916 12/09/16 0928  BP: (!) 152/58 (!) 154/52  Pulse: 66 70  Resp: 14 14  Temp: (!) 36.1 C     Last Pain:  Vitals:   12/10/16 0837  TempSrc:   PainSc: 2                  Martha Clan

## 2016-12-24 ENCOUNTER — Other Ambulatory Visit: Payer: Medicare HMO

## 2016-12-24 DIAGNOSIS — C61 Malignant neoplasm of prostate: Secondary | ICD-10-CM

## 2016-12-25 LAB — PSA: Prostate Specific Ag, Serum: 0.5 ng/mL (ref 0.0–4.0)

## 2016-12-25 NOTE — Progress Notes (Signed)
9:59 AM   Richard Elliott Oct 22, 1924 [id number]  Referring provider: Ezequiel Kayser, MD [facility] [place], [place] [address]  Chief Complaint  Patient presents with  . Urinary Retention    6 month follow up   . Prostate Cancer  . Erectile Dysfunction    HPI: Patient is an 83 year old Caucasian male with a history of urinary retention and prostate cancer who presents today for 6 month follow-up.  Patient is status post cryoablation by Dr. Ernst Spell in 2010.  His last PSA on 12/24/2016 was 0.5.  His urinary symptoms are much improved.  He reports a good urinary flow, minimal hesitancy, no sensation of incomplete bladder emptying. He does state that he experiences some occasional leakage including post void dribbling. He states that he previously was wearing tight fitting underwear and has since switched to boxer briefs which he believes has also significantly improved his urinary leakage. He is doing well on Flomax and finasteride.    Patient is complaining of a penile seepage which he attributes to his semen.  He states this happens when he strains.    His I PSS score today it 7/3.  His previous IPSS score was 10/3.  His PVR is 84 mL.        IPSS    Row Name 12/26/16 0900         International Prostate Symptom Score   How often have you had the sensation of not emptying your bladder? Not at All     How often have you had to urinate less than every two hours? Less than half the time     How often have you found you stopped and started again several times when you urinated? Not at All     How often have you found it difficult to postpone urination? More than half the time     How often have you had a weak urinary stream? Not at All     How often have you had to strain to start urination? Not at All     How many times did you typically get up at night to urinate? 1 Time     Total IPSS Score 7       Quality of Life due to urinary symptoms   If you  were to spend the rest of your life with your urinary condition just the way it is now how would you feel about that? Mixed        Score:  1-7 Mild 8-19 Moderate 20-35 Severe   PMH: Past Medical History:  Diagnosis Date  . Allergic rhinitis 08/04/2014  . Aortic heart valve narrowing 11/05/2011   Overview:  a.  03/2010:  moderate.  33 mmHg peak grad 24 mmHg mean grad 1.3 cm2    . Arthritis or polyarthritis, rheumatoid ([place]) 11/05/2011   wrist, hands, hips, knees  . Atrial flutter ([place]) 04/16/2013   Overview:  10/14 hosp, felt to be secondary to choking episode.  TEE and DC CV.   . Bacterial urinary infection 11/05/2011   Overview:  Urinary tract infections/prostatitis.  Followed by a urologist in [place], [place].   Marland Kitchen Benign fibroma of prostate 11/05/2011   Overview:  a. Status post prostatic biopsy x 2 which have been negative.   . BP (high blood pressure) 11/05/2011  . CAD in native artery 08/04/2014   Overview:  a. Status post PCI of RCA x 2.  b. 4/00 Positive Myoview. Catheterization revealed EF  70% with 3-vessel disease.  c. 11/01/98 CABG x 3 (by Dr. Ronnald Ramp), LIMA to LAD, SVG to RCA, SVG to ramus.  d. 7/00 catheterization, 100% LIMA, patent vein grafts.  e. 7/00 & 9/00 PCI of LAD.  f. 1/01 Redo CABG (by Dr. Corinna Capra), RIMA to distal LAD, SVG to D1, EF 65% at time of cardiac catheterization.     . Chronic otitis externa 11/05/2011  . Compressed spine fracture ([place]) 11/05/2011   Overview:  11/97 Lumbar collapsed vertebrae with impingement.    . Deafness, sensorineural 12/30/2012  . GERD (gastroesophageal reflux disease)   . Headache    enviornmental allergies  . Hearing aid worn    bilateral  . HLD (hyperlipidemia) 11/05/2011   Overview:  a. 2/07 Lipid profile:  TC 239, TG 232, HDL 35, LDL 158.  b. Many intolerances to Lipid medications.   Marland Kitchen HOH (hard of hearing)   . Mitral stenosis 12/28/2013   Overview:  Moderate per ECHO 10/14 with mean grad 4 and peak grad 31mmHg ALSO mention of mid LV gradient  of 22mmHG   . Myocardial infarction [facility]) 1995 & 2017  . Prostate cancer [facility])     Surgical History: Past Surgical History:  Procedure Laterality Date  . A FLUTTER ABLATION  2012  . AORTIC VALVE REPLACEMENT  06/12/2016   [facility]  . BALLOON DILATION Left 12/09/2016   Procedure: BALLOON DILATION OF LEFT EUSTACHIAN TUBE;  Surgeon: Margaretha Sheffield, MD;  Location: [facility] ORS;  Service: ENT;  Laterality: Left;  . CARDIAC CATHETERIZATION    . CARDIAC SURGERY  1991  . CORONARY ARTERY BYPASS GRAFT  1999 & 2000  . NASAL ENDOSCOPY Bilateral 12/09/2016   Procedure: NASAL ENDOSCOPY;  Surgeon: Margaretha Sheffield, MD;  Location: [facility] ORS;  Service: ENT;  Laterality: Bilateral;  . PROSTATE SURGERY      Home Medications:  Allergies as of 12/26/2016      Reactions   Eggs Or Egg-derived Products Itching   Digestive issues and itching ONLY, per daughter   Lisinopril    Cough   Atorvastatin    Pain to walk, stiffness in joints   Fenofibrate    Leg Pain   Niacin    Hot flashes   Peanut-containing Drug Products Itching   Eating a large of peanuts continuously. Same reaction with other tree nuts.   Simvastatin    Pain to walk and joint stiffness in legs   Soy Allergy Other (See Comments)   Digestive issues   Wheat Bran    Digestive issues   Aspirin Nausea Only    GI Upset, in high doses in uncoated aspirin    Nitrofuran Derivatives    GI UPSET      Medication List       Accurate as of 12/26/16  9:59 AM. Always use your most recent med list.          acetaminophen 650 MG CR tablet Commonly known as:  TYLENOL Take 650 mg by mouth 2 (two) times daily as needed for pain.   aspirin 81 MG tablet Take 81 mg by mouth daily.   Biotin 1000 MCG tablet Take 1,000 mcg by mouth 2 (two) times a week.   calcium carbonate 500 MG chewable tablet Commonly known as:  TUMS - dosed in mg elemental calcium Chew 1 tablet by mouth daily as needed for indigestion or heartburn.   cetirizine 10 MG tablet Commonly  known as:  ZYRTEC Take 10 mg by mouth daily.   CRANBERRY PO  Take 1 tablet by mouth daily as needed (urinary pain).   diphenhydrAMINE 25 mg capsule Commonly known as:  BENADRYL Take 25 mg by mouth 2 (two) times daily as needed for allergies. Reported on 01/16/2016   ELIQUIS 2.5 MG Tabs tablet Generic drug:  apixaban Take 2.5 mg by mouth 2 (two) times daily.   FAMOTIDINE PO Take by mouth.   finasteride 5 MG tablet Commonly known as:  PROSCAR Take 1 tablet (5 mg total) by mouth daily.   fluticasone 50 MCG/ACT nasal spray Commonly known as:  FLONASE Place 1 spray into the nose at bedtime.   GLUCOSAMINE 1500 COMPLEX Caps Take by mouth.   GLUCOSAMINE PO Take 1 tablet by mouth daily.   losartan 50 MG tablet Commonly known as:  COZAAR Take 50 mg by mouth daily.   magnesium chloride 64 MG Tbec SR tablet Commonly known as:  SLOW-MAG Take 1 tablet by mouth 2 (two) times daily.   metoprolol tartrate 25 MG tablet Commonly known as:  LOPRESSOR Take 25 mg by mouth 2 (two) times daily.   montelukast 10 MG tablet Commonly known as:  SINGULAIR Take 10 mg by mouth daily.   nitroGLYCERIN 0.4 MG SL tablet Commonly known as:  NITROSTAT Place 1 tablet under tongue as needed for chest pain (may repeat every 5 minutes but seek medical help if pain persists after 3 tablets) as needed   omeprazole 20 MG capsule Commonly known as:  PRILOSEC Take 20 mg by mouth daily as needed (heartburn).   polyethylene glycol packet Commonly known as:  MIRALAX / GLYCOLAX Take 17 g by mouth daily as needed for mild constipation.   Potassium 99 MG Tabs Take 99 mg by mouth daily as needed (nutritional supplementation).   senna-docusate 8.6-50 MG tablet Commonly known as:  Senokot-S Take 1 tablet by mouth at bedtime as needed for mild constipation.   tamsulosin 0.4 MG Caps capsule Commonly known as:  FLOMAX Take 1 capsule (0.4 mg total) by mouth daily.   traMADol 50 MG tablet Commonly known as:   ULTRAM Take 50 mg by mouth 2 (two) times daily as needed for moderate pain.   vitamin B-12 1000 MCG tablet Commonly known as:  CYANOCOBALAMIN Take 1,000 mcg by mouth 2 (two) times a week.       Allergies:  Allergies  Allergen Reactions  . Eggs Or Egg-Derived Products Itching    Digestive issues and itching ONLY, per daughter  . Lisinopril     Cough  . Atorvastatin     Pain to walk, stiffness in joints  . Fenofibrate     Leg Pain  . Niacin     Hot flashes  . Peanut-Containing Drug Products Itching    Eating a large of peanuts continuously. Same reaction with other tree nuts.  . Simvastatin     Pain to walk and joint stiffness in legs  . Soy Allergy Other (See Comments)    Digestive issues  . Wheat Bran     Digestive issues  . Aspirin Nausea Only     GI Upset, in high doses in uncoated aspirin   . Nitrofuran Derivatives     GI UPSET    Family History: Family History  Problem Relation Age of Onset  . Heart failure Mother   . Leukemia Father   . Hypertension Paternal Grandmother   . Heart attack Maternal Grandfather   . Breast cancer Sister   . Kidney disease Neg Hx   . Prostate cancer Neg  Hx   . Kidney cancer Neg Hx   . Bladder Cancer Neg Hx     Social History:  reports that he has never smoked. He has never used smokeless tobacco. He reports that he drinks alcohol. He reports that he does not use drugs.  ROS: UROLOGY Frequent Urination?: No Hard to postpone urination?: No Burning/pain with urination?: No Get up at night to urinate?: No Leakage of urine?: No Urine stream starts and stops?: No Trouble starting stream?: No Do you have to strain to urinate?: No Blood in urine?: No Urinary tract infection?: No Sexually transmitted disease?: No Injury to kidneys or bladder?: No Painful intercourse?: No Weak stream?: No Erection problems?: No Penile pain?: No  Gastrointestinal Nausea?: No Vomiting?: No Indigestion/heartburn?: No Diarrhea?:  No Constipation?: No  Constitutional Fever: No Night sweats?: No Weight loss?: No Fatigue?: No  Skin Skin rash/lesions?: No Itching?: No  Eyes Blurred vision?: No Double vision?: No  Ears/Nose/Throat Sore throat?: No Sinus problems?: Yes  Hematologic/Lymphatic Swollen glands?: No Easy bruising?: No  Cardiovascular Leg swelling?: No Chest pain?: No  Respiratory Cough?: No Shortness of breath?: Yes  Endocrine Excessive thirst?: No  Musculoskeletal Back pain?: Yes Joint pain?: Yes  Neurological Headaches?: No Dizziness?: No  Psychologic Depression?: No Anxiety?: No  Physical Exam: BP (!) 126/58   Pulse 72   Ht 5\' 6"  (1.676 m)   Wt 172 lb 6.4 oz (78.2 kg)   BMI 27.83 kg/m   Constitutional: Well nourished. Alert and oriented, No acute distress. HEENT: [place] AT, moist mucus membranes. Trachea midline, no masses. Cardiovascular: No clubbing, cyanosis, or edema. Respiratory: Normal respiratory effort, no increased work of breathing. GI: Abdomen is soft, non tender, non distended, no abdominal masses. Liver and spleen not palpable.  No hernias appreciated.  Stool sample for occult testing is not indicated.   GU: No CVA tenderness.  No bladder fullness or masses.  Patient with uncircumcised phallus.  Foreskin easily retracted  Urethral meatus is patent.  No penile discharge. No penile lesions or rashes. Scrotum without lesions, cysts, rashes and/or edema.  Testicles are located scrotally bilaterally. No masses are appreciated in the testicles. Left and right epididymis are normal. Rectal: Patient with  normal sphincter tone. Anus and perineum without scarring or rashes. No rectal masses are appreciated. Prostate is approximately 45 grams, firm in the right lobe, no nodules are appreciated. Seminal vesicles are normal. Skin: No rashes, bruises or suspicious lesions. Lymph: No cervical or inguinal adenopathy. Neurologic: Grossly intact, no focal deficits, moving all  4 extremities. Psychiatric: Normal mood and affect.  Laboratory Data: PSA History  0.1 ng/mL on 10/23/2015  0.2 ng/mL on 06/24/2016  0.5 ng/mL on 12/24/2016  Pertinent imaging Results for OTHELL, JAIME (MRN [id number]) as of 12/26/2016 09:56  Ref. Range 12/26/2016 09:49  Scan Result Unknown [id number]    Assessment & Plan:    1. History of urinary retention  - Patient reports no further episodes of urinary retention. He reports his symptoms have significantly proved on finasteride and Flomax. He is tolerating the medications well and is satisfied with symptom management. We will refill his prescriptions today.  2. History of prostate cancer- S/p cryoablation by Dr. Madelin Headings 2010.  Last PSA 0.5 ng/mL.  PSA nadir 0.3?  Recent PSA is 0.5.  We will see him in 6 months for IPSS, PSA and exam.    3. Ejaculatory disorder:  Patient continues his tamsulosin.  This is most likely urine leaking.    Return  in about 6 months (around 06/27/2017) for IPSS, PSA and exam.  Zara Council, [facility]  [place] [address], [place], [place] [address] [phone]

## 2016-12-26 ENCOUNTER — Encounter: Payer: Self-pay | Admitting: Urology

## 2016-12-26 ENCOUNTER — Ambulatory Visit (INDEPENDENT_AMBULATORY_CARE_PROVIDER_SITE_OTHER): Payer: Medicare HMO | Admitting: Urology

## 2016-12-26 VITALS — BP 126/58 | HR 72 | Ht 66.0 in | Wt 172.4 lb

## 2016-12-26 DIAGNOSIS — Z87898 Personal history of other specified conditions: Secondary | ICD-10-CM

## 2016-12-26 DIAGNOSIS — Z8546 Personal history of malignant neoplasm of prostate: Secondary | ICD-10-CM | POA: Diagnosis not present

## 2016-12-26 LAB — BLADDER SCAN AMB NON-IMAGING: Scan Result: 84

## 2016-12-26 MED ORDER — FINASTERIDE 5 MG PO TABS: 5.0000 mg | ORAL_TABLET | Freq: Every day | ORAL | 3 refills | Status: DC

## 2016-12-26 MED ORDER — TAMSULOSIN HCL 0.4 MG PO CAPS: 0.4000 mg | ORAL_CAPSULE | Freq: Every day | ORAL | 3 refills | Status: DC

## 2017-07-14 ENCOUNTER — Other Ambulatory Visit: Payer: Medicare HMO

## 2017-07-14 ENCOUNTER — Other Ambulatory Visit: Payer: Self-pay

## 2017-07-14 DIAGNOSIS — Z8546 Personal history of malignant neoplasm of prostate: Secondary | ICD-10-CM

## 2017-07-15 LAB — PSA: Prostate Specific Ag, Serum: 0.9 ng/mL (ref 0.0–4.0)

## 2017-07-16 NOTE — Progress Notes (Signed)
8:25 PM   Richard Elliott [birth year] [id number]  Referring provider: Ezequiel Kayser, MD [facility] [place], [place] [address]  Chief Complaint  Patient presents with  . Follow-up  . Benign Prostatic Hypertrophy    HPI: Patient is an 84 year old Caucasian male with a history of urinary retention and prostate cancer who presents today for 6 month follow-up.  Patient is status post cryoablation by Dr. Madelin Headings in 2010.  His last PSA on 07/14/2017 was 0.9.  He is having symptoms of urge incontinence that are becoming bothersome to him. He is on Flomax and finasteride.    He is taking a fluid pill for pedal edema prn.  He states the urge incontinence goes away when he takes the fluid pill.  He is wanting to take half a fluid pill daily.  He is not having gross hematuria, suprapubic pain or dysuria.  He is not having fevers, chills, nausea or vomiting.    His I PSS score today it 18/4.  His PVR is 20 mL.  His previous IPSS score was 7/3.  His PVR is 84 mL.    IPSS    Row Name 07/17/17 0900         International Prostate Symptom Score   How often have you had the sensation of not emptying your bladder?  Less than half the time     How often have you had to urinate less than every two hours?  About half the time     How often have you found you stopped and started again several times when you urinated?  Less than half the time     How often have you found it difficult to postpone urination?  More than half the time     How often have you had a weak urinary stream?  About half the time     How often have you had to strain to start urination?  Less than half the time     How many times did you typically get up at night to urinate?  2 Times     Total IPSS Score  18       Quality of Life due to urinary symptoms   If you were to spend the rest of your life with your urinary condition just the way it is now how would you feel about that?  Mostly Disatisfied          Score:  1-7 Mild 8-19 Moderate 20-35 Severe   PMH: Past Medical History:  Diagnosis Date  . Allergic rhinitis 08/04/2014  . Aortic heart valve narrowing 11/05/2011   Overview:  a.  03/2010:  moderate.  33 mmHg peak grad 24 mmHg mean grad 1.3 cm2    . Arthritis or polyarthritis, rheumatoid ([place]) 11/05/2011   wrist, hands, hips, knees  . Atrial flutter ([place]) 04/16/2013   Overview:  10/14 hosp, felt to be secondary to choking episode.  TEE and DC CV.   . Bacterial urinary infection 11/05/2011   Overview:  Urinary tract infections/prostatitis.  Followed by a urologist in [place], [place].   Marland Kitchen Benign fibroma of prostate 11/05/2011   Overview:  a. Status post prostatic biopsy x 2 which have been negative.   . BP (high blood pressure) 11/05/2011  . CAD in native artery 08/04/2014   Overview:  a. Status post PCI of RCA x 2.  b. 4/00 Positive Myoview. Catheterization revealed EF 70% with 3-vessel disease.  c. 11/01/98 CABG  x 3 (by Dr. Ronnald Ramp), LIMA to LAD, SVG to RCA, SVG to ramus.  d. 7/00 catheterization, 100% LIMA, patent vein grafts.  e. 7/00 & 9/00 PCI of LAD.  f. 1/01 Redo CABG (by Dr. Corinna Capra), RIMA to distal LAD, SVG to D1, EF 65% at time of cardiac catheterization.     . Chronic otitis externa 11/05/2011  . Compressed spine fracture ([place]) 11/05/2011   Overview:  11/97 Lumbar collapsed vertebrae with impingement.    . Deafness, sensorineural 12/30/2012  . GERD (gastroesophageal reflux disease)   . Headache    enviornmental allergies  . Hearing aid worn    bilateral  . HLD (hyperlipidemia) 11/05/2011   Overview:  a. 2/07 Lipid profile:  TC 239, TG 232, HDL 35, LDL 158.  b. Many intolerances to Lipid medications.   Marland Kitchen HOH (hard of hearing)   . Mitral stenosis 12/28/2013   Overview:  Moderate per ECHO 10/14 with mean grad 4 and peak grad 13mmHg ALSO mention of mid LV gradient of 7mmHG   . Myocardial infarction [facility]) 1995 & 2017  . Prostate cancer [facility])     Surgical History: Past Surgical History:   Procedure Laterality Date  . A FLUTTER ABLATION  2012  . AORTIC VALVE REPLACEMENT  06/12/2016   [facility]  . BALLOON DILATION Left 12/09/2016   Procedure: BALLOON DILATION OF LEFT EUSTACHIAN TUBE;  Surgeon: Margaretha Sheffield, MD;  Location: [facility] ORS;  Service: ENT;  Laterality: Left;  . CARDIAC CATHETERIZATION    . CARDIAC SURGERY  1991  . CORONARY ARTERY BYPASS GRAFT  1999 & 2000  . NASAL ENDOSCOPY Bilateral 12/09/2016   Procedure: NASAL ENDOSCOPY;  Surgeon: Margaretha Sheffield, MD;  Location: [facility] ORS;  Service: ENT;  Laterality: Bilateral;  . PROSTATE SURGERY      Home Medications:  Allergies as of 07/17/2017      Reactions   Eggs Or Egg-derived Products Itching   Digestive issues and itching ONLY, per daughter   Lisinopril    Cough   Atorvastatin    Pain to walk, stiffness in joints   Fenofibrate    Leg Pain   Niacin    Hot flashes   Peanut-containing Drug Products Itching   Eating a large of peanuts continuously. Same reaction with other tree nuts.   Simvastatin    Pain to walk and joint stiffness in legs   Soy Allergy Other (See Comments)   Digestive issues   Wheat Bran    Digestive issues   Aspirin Nausea Only    GI Upset, in high doses in uncoated aspirin    Nitrofuran Derivatives    GI UPSET      Medication List        Accurate as of 07/17/17 11:59 PM. Always use your most recent med list.          acetaminophen 650 MG CR tablet Commonly known as:  TYLENOL Take 650 mg by mouth 2 (two) times daily as needed for pain.   aspirin 81 MG tablet Take 81 mg by mouth daily.   Biotin 1000 MCG tablet Take 1,000 mcg by mouth 2 (two) times a week.   calcium carbonate 500 MG chewable tablet Commonly known as:  TUMS - dosed in mg elemental calcium Chew 1 tablet by mouth daily as needed for indigestion or heartburn.   cetirizine 10 MG tablet Commonly known as:  ZYRTEC Take 10 mg by mouth daily.   CRANBERRY PO Take 1 tablet by mouth daily as needed (urinary  pain).    diphenhydrAMINE 25 mg capsule Commonly known as:  BENADRYL Take 25 mg by mouth 2 (two) times daily as needed for allergies. Reported on 01/16/2016   ELIQUIS 2.5 MG Tabs tablet Generic drug:  apixaban Take 2.5 mg by mouth 2 (two) times daily.   FAMOTIDINE PO Take by mouth.   finasteride 5 MG tablet Commonly known as:  PROSCAR Take 1 tablet (5 mg total) by mouth daily.   fluticasone 50 MCG/ACT nasal spray Commonly known as:  FLONASE Place 1 spray into the nose at bedtime.   GLUCOSAMINE 1500 COMPLEX Caps Take by mouth.   GLUCOSAMINE PO Take 1 tablet by mouth daily.   losartan 50 MG tablet Commonly known as:  COZAAR Take 50 mg by mouth daily.   magnesium chloride 64 MG Tbec SR tablet Commonly known as:  SLOW-MAG Take 1 tablet by mouth 2 (two) times daily.   metoprolol tartrate 25 MG tablet Commonly known as:  LOPRESSOR Take 25 mg by mouth 2 (two) times daily.   montelukast 10 MG tablet Commonly known as:  SINGULAIR Take 10 mg by mouth daily.   nitroGLYCERIN 0.4 MG SL tablet Commonly known as:  NITROSTAT Place 1 tablet under tongue as needed for chest pain (may repeat every 5 minutes but seek medical help if pain persists after 3 tablets) as needed   omeprazole 20 MG capsule Commonly known as:  PRILOSEC Take 20 mg by mouth daily as needed (heartburn).   polyethylene glycol packet Commonly known as:  MIRALAX / GLYCOLAX Take 17 g by mouth daily as needed for mild constipation.   Potassium 99 MG Tabs Take 99 mg by mouth daily as needed (nutritional supplementation).   senna-docusate 8.6-50 MG tablet Commonly known as:  Senokot-S Take 1 tablet by mouth at bedtime as needed for mild constipation.   tamsulosin 0.4 MG Caps capsule Commonly known as:  FLOMAX Take 1 capsule (0.4 mg total) by mouth daily.   traMADol 50 MG tablet Commonly known as:  ULTRAM Take 50 mg by mouth 2 (two) times daily as needed for moderate pain.   vitamin B-12 1000 MCG tablet Commonly  known as:  CYANOCOBALAMIN Take 1,000 mcg by mouth 2 (two) times a week.       Allergies:  Allergies  Allergen Reactions  . Eggs Or Egg-Derived Products Itching    Digestive issues and itching ONLY, per daughter  . Lisinopril     Cough  . Atorvastatin     Pain to walk, stiffness in joints  . Fenofibrate     Leg Pain  . Niacin     Hot flashes  . Peanut-Containing Drug Products Itching    Eating a large of peanuts continuously. Same reaction with other tree nuts.  . Simvastatin     Pain to walk and joint stiffness in legs  . Soy Allergy Other (See Comments)    Digestive issues  . Wheat Bran     Digestive issues  . Aspirin Nausea Only     GI Upset, in high doses in uncoated aspirin   . Nitrofuran Derivatives     GI UPSET    Family History: Family History  Problem Relation Age of Onset  . Heart failure Mother   . Leukemia Father   . Hypertension Paternal Grandmother   . Heart attack Maternal Grandfather   . Breast cancer Sister   . Kidney disease Neg Hx   . Prostate cancer Neg Hx   . Kidney cancer Neg Hx   .  Bladder Cancer Neg Hx     Social History:  reports that  has never smoked. he has never used smokeless tobacco. He reports that he drinks alcohol. He reports that he does not use drugs.  ROS: UROLOGY Frequent Urination?: No Hard to postpone urination?: No Burning/pain with urination?: No Get up at night to urinate?: No Leakage of urine?: No Urine stream starts and stops?: No Trouble starting stream?: No Do you have to strain to urinate?: No Blood in urine?: No Urinary tract infection?: No Sexually transmitted disease?: No Injury to kidneys or bladder?: No Painful intercourse?: No Weak stream?: No Erection problems?: No Penile pain?: No  Gastrointestinal Nausea?: No Vomiting?: No Indigestion/heartburn?: No Diarrhea?: No Constipation?: No  Constitutional Fever: No Night sweats?: No Weight loss?: No Fatigue?: No  Skin Skin rash/lesions?:  No Itching?: No  Eyes Blurred vision?: No Double vision?: No  Ears/Nose/Throat Sore throat?: No Sinus problems?: No  Hematologic/Lymphatic Swollen glands?: No Easy bruising?: No  Cardiovascular Leg swelling?: Yes Chest pain?: No  Respiratory Cough?: No Shortness of breath?: No  Endocrine Excessive thirst?: No  Musculoskeletal Back pain?: No Joint pain?: Yes  Neurological Headaches?: No Dizziness?: No  Psychologic Depression?: No Anxiety?: No  Physical Exam: BP (!) 154/69   Pulse 73   Ht 5\' 6"  (1.676 m)   Wt 172 lb (78 kg)   BMI 27.76 kg/m   Constitutional: Well nourished. Alert and oriented, No acute distress. HEENT: [place] AT, moist mucus membranes. Trachea midline, no masses. Cardiovascular: No clubbing, cyanosis, or edema. Respiratory: Normal respiratory effort, no increased work of breathing. Skin: No rashes, bruises or suspicious lesions. Lymph: No cervical or inguinal adenopathy. Neurologic: Grossly intact, no focal deficits, moving all 4 extremities. Psychiatric: Normal mood and affect.   Laboratory Data: PSA History  0.1 ng/mL on 10/23/2015  0.2 ng/mL on 06/24/2016  0.5 ng/mL on 12/24/2016  0.9 ng/mL on 07/14/2017  I have reviewed labs.    Pertinent imaging Results for KYLEE, NARDOZZI (MRN [id number]) as of 07/17/2017 10:34  Ref. Range 07/17/2017 10:31  Scan Result Unknown 20     Assessment & Plan:    1. Rising PSA after prostate cancer treatment  - we will continue to monitor at this time as patient has significant cardiac disease - if the PSA reaches 2.0 or greater will consider imaging studies  2. History of urinary retention  - Patient reports no further episodes of urinary retention. He reports his symptoms have significantly proved on finasteride and Flomax. He is tolerating the medications well and is satisfied with symptom management. We will refill his prescriptions today.  3. History of prostate cancer- S/p cryoablation by  Dr. Madelin Headings 2010.  Last PSA 0.5 ng/mL.  PSA nadir 0.3?  Recent PSA is 0.5.  We will see him in 6 months for IPSS, PSA and exam.    4. Urge incontinence  - explained to patient that I cannot advise on diuretic dosing and he will need to speak with Dr. Raechel Ache    - will give a trial of Myrbetriq 25 mg, # 28 samples given - I have advised the patient of the side effects of Myrbetriq, such as: elevation in BP, urinary retention and/or HA.  - RTC in one month for I PSS and PVR   Return in about 1 month (around 08/17/2017) for I PSS and PVR.  Zara Council, [facility] [address], [place], [place] [address] [phone]

## 2017-07-17 ENCOUNTER — Ambulatory Visit: Payer: Medicare HMO | Admitting: Urology

## 2017-07-17 ENCOUNTER — Encounter: Payer: Self-pay | Admitting: Urology

## 2017-07-17 VITALS — BP 154/69 | HR 73 | Ht 66.0 in | Wt 172.0 lb

## 2017-07-17 DIAGNOSIS — N3941 Urge incontinence: Secondary | ICD-10-CM

## 2017-07-17 DIAGNOSIS — Z87898 Personal history of other specified conditions: Secondary | ICD-10-CM | POA: Diagnosis not present

## 2017-07-17 DIAGNOSIS — Z8546 Personal history of malignant neoplasm of prostate: Secondary | ICD-10-CM | POA: Diagnosis not present

## 2017-07-17 DIAGNOSIS — R9721 Rising PSA following treatment for malignant neoplasm of prostate: Secondary | ICD-10-CM | POA: Diagnosis not present

## 2017-07-17 LAB — BLADDER SCAN AMB NON-IMAGING: SCAN RESULT: 20

## 2017-08-20 NOTE — Progress Notes (Signed)
2:01 PM   Richard Elliott Jul 05, 1925 [id number]  Referring provider: Ezequiel Kayser, MD [facility] [place], [place] [address]  Chief Complaint  Patient presents with  . Follow-up    HPI: 84 yo WM with a history of urinary retention, prostate cancer and urge incontinence who presents today for a one month follow up after a trial of Myrbetriq.    Patient is status post cryoablation by Dr. Madelin Headings in 2010.  His last PSA on 07/14/2017 was 0.9.  He is having symptoms of urge incontinence that are becoming bothersome to him. He is on Flomax and finasteride.    He is taking a fluid pill for pedal edema prn.  He states the urge incontinence goes away when he takes the fluid pill.  He is wanting to take half a fluid pill daily.  He is not having gross hematuria, suprapubic pain or dysuria.  He is not having fevers, chills, nausea or vomiting.    Patient was started on Myrbetriq for urge incontinence on 07/17/2017.    His I PSS score today 12/3.  His PVR is 0  mL.  His previous IPSS score was 18/4.  His PVR is 20 mL.  His major complaints today are urgency, nocturia, incontinence and straining to urinate.  He feels that the Myrbetriq was helpful.  His BP is 164/70,    IPSS    Row Name 07/17/17 0900 08/21/17 1300       International Prostate Symptom Score   How often have you had the sensation of not emptying your bladder?  Less than half the time  Not at All    How often have you had to urinate less than every two hours?  About half the time  Not at All    How often have you found you stopped and started again several times when you urinated?  Less than half the time  Not at All    How often have you found it difficult to postpone urination?  More than half the time  Almost always    How often have you had a weak urinary stream?  About half the time  Almost always    How often have you had to strain to start urination?  Less than half the time  Less than half the  time    How many times did you typically get up at night to urinate?  2 Times  None    Total IPSS Score  18  12      Quality of Life due to urinary symptoms   If you were to spend the rest of your life with your urinary condition just the way it is now how would you feel about that?  Mostly Disatisfied  Mixed       Score:  1-7 Mild 8-19 Moderate 20-35 Severe   PMH: Past Medical History:  Diagnosis Date  . Allergic rhinitis 08/04/2014  . Aortic heart valve narrowing 11/05/2011   Overview:  a.  03/2010:  moderate.  33 mmHg peak grad 24 mmHg mean grad 1.3 cm2    . Arthritis or polyarthritis, rheumatoid ([place]) 11/05/2011   wrist, hands, hips, knees  . Atrial flutter ([place]) 04/16/2013   Overview:  10/14 hosp, felt to be secondary to choking episode.  TEE and DC CV.   . Bacterial urinary infection 11/05/2011   Overview:  Urinary tract infections/prostatitis.  Followed by a urologist in [place], [place].   Marland Kitchen Benign  fibroma of prostate 11/05/2011   Overview:  a. Status post prostatic biopsy x 2 which have been negative.   . BP (high blood pressure) 11/05/2011  . CAD in native artery 08/04/2014   Overview:  a. Status post PCI of RCA x 2.  b. 4/00 Positive Myoview. Catheterization revealed EF 70% with 3-vessel disease.  c. 11/01/98 CABG x 3 (by Dr. Ronnald Ramp), LIMA to LAD, SVG to RCA, SVG to ramus.  d. 7/00 catheterization, 100% LIMA, patent vein grafts.  e. 7/00 & 9/00 PCI of LAD.  f. 1/01 Redo CABG (by Dr. Corinna Capra), RIMA to distal LAD, SVG to D1, EF 65% at time of cardiac catheterization.     . Chronic otitis externa 11/05/2011  . Compressed spine fracture ([place]) 11/05/2011   Overview:  11/97 Lumbar collapsed vertebrae with impingement.    . Deafness, sensorineural 12/30/2012  . GERD (gastroesophageal reflux disease)   . Headache    enviornmental allergies  . Hearing aid worn    bilateral  . HLD (hyperlipidemia) 11/05/2011   Overview:  a. 2/07 Lipid profile:  TC 239, TG 232, HDL 35, LDL 158.  b. Many  intolerances to Lipid medications.   Marland Kitchen HOH (hard of hearing)   . Mitral stenosis 12/28/2013   Overview:  Moderate per ECHO 10/14 with mean grad 4 and peak grad 88mmHg ALSO mention of mid LV gradient of 1mmHG   . Myocardial infarction [facility]) 1995 & 2017  . Prostate cancer [facility])     Surgical History: Past Surgical History:  Procedure Laterality Date  . A FLUTTER ABLATION  2012  . AORTIC VALVE REPLACEMENT  06/12/2016   [facility]  . BALLOON DILATION Left 12/09/2016   Procedure: BALLOON DILATION OF LEFT EUSTACHIAN TUBE;  Surgeon: Margaretha Sheffield, MD;  Location: [facility] ORS;  Service: ENT;  Laterality: Left;  . CARDIAC CATHETERIZATION    . CARDIAC SURGERY  1991  . CORONARY ARTERY BYPASS GRAFT  1999 & 2000  . NASAL ENDOSCOPY Bilateral 12/09/2016   Procedure: NASAL ENDOSCOPY;  Surgeon: Margaretha Sheffield, MD;  Location: [facility] ORS;  Service: ENT;  Laterality: Bilateral;  . PROSTATE SURGERY      Home Medications:  Allergies as of 08/21/2017      Reactions   Eggs Or Egg-derived Products Itching   Digestive issues and itching ONLY, per daughter   Lisinopril    Cough   Atorvastatin    Pain to walk, stiffness in joints   Fenofibrate    Leg Pain   Niacin    Hot flashes   Peanut-containing Drug Products Itching   Eating a large of peanuts continuously. Same reaction with other tree nuts.   Simvastatin    Pain to walk and joint stiffness in legs   Soy Allergy Other (See Comments)   Digestive issues   Wheat Bran    Digestive issues   Aspirin Nausea Only    GI Upset, in high doses in uncoated aspirin    Nitrofuran Derivatives    GI UPSET      Medication List        Accurate as of 08/21/17  2:01 PM. Always use your most recent med list.          acetaminophen 650 MG CR tablet Commonly known as:  TYLENOL Take 650 mg by mouth 2 (two) times daily as needed for pain.   aspirin 81 MG tablet Take 81 mg by mouth daily.   Biotin 1000 MCG tablet Take 1,000 mcg by mouth 2 (two) times  a week.     calcium carbonate 500 MG chewable tablet Commonly known as:  TUMS - dosed in mg elemental calcium Chew 1 tablet by mouth daily as needed for indigestion or heartburn.   cetirizine 10 MG tablet Commonly known as:  ZYRTEC Take 10 mg by mouth daily.   CRANBERRY PO Take 1 tablet by mouth daily as needed (urinary pain).   diphenhydrAMINE 25 mg capsule Commonly known as:  BENADRYL Take 25 mg by mouth 2 (two) times daily as needed for allergies. Reported on 01/16/2016   ELIQUIS 2.5 MG Tabs tablet Generic drug:  apixaban Take 2.5 mg by mouth 2 (two) times daily.   FAMOTIDINE PO Take by mouth.   finasteride 5 MG tablet Commonly known as:  PROSCAR Take 1 tablet (5 mg total) by mouth daily.   fluticasone 50 MCG/ACT nasal spray Commonly known as:  FLONASE Place 1 spray into the nose at bedtime.   GLUCOSAMINE 1500 COMPLEX Caps Take by mouth.   GLUCOSAMINE PO Take 1 tablet by mouth daily.   losartan 50 MG tablet Commonly known as:  COZAAR Take 50 mg by mouth daily.   magnesium chloride 64 MG Tbec SR tablet Commonly known as:  SLOW-MAG Take 1 tablet by mouth 2 (two) times daily.   metoprolol tartrate 25 MG tablet Commonly known as:  LOPRESSOR Take 25 mg by mouth 2 (two) times daily.   mirabegron ER 25 MG Tb24 tablet Commonly known as:  MYRBETRIQ Take 1 tablet (25 mg total) by mouth daily.   montelukast 10 MG tablet Commonly known as:  SINGULAIR Take 10 mg by mouth daily.   nitroGLYCERIN 0.4 MG SL tablet Commonly known as:  NITROSTAT Place 1 tablet under tongue as needed for chest pain (may repeat every 5 minutes but seek medical help if pain persists after 3 tablets) as needed   omeprazole 20 MG capsule Commonly known as:  PRILOSEC Take 20 mg by mouth daily as needed (heartburn).   polyethylene glycol packet Commonly known as:  MIRALAX / GLYCOLAX Take 17 g by mouth daily as needed for mild constipation.   Potassium 99 MG Tabs Take 99 mg by mouth daily as needed  (nutritional supplementation).   senna-docusate 8.6-50 MG tablet Commonly known as:  Senokot-S Take 1 tablet by mouth at bedtime as needed for mild constipation.   tamsulosin 0.4 MG Caps capsule Commonly known as:  FLOMAX Take 1 capsule (0.4 mg total) by mouth daily.   traMADol 50 MG tablet Commonly known as:  ULTRAM Take 50 mg by mouth 2 (two) times daily as needed for moderate pain.   vitamin B-12 1000 MCG tablet Commonly known as:  CYANOCOBALAMIN Take 1,000 mcg by mouth 2 (two) times a week.       Allergies:  Allergies  Allergen Reactions  . Eggs Or Egg-Derived Products Itching    Digestive issues and itching ONLY, per daughter  . Lisinopril     Cough  . Atorvastatin     Pain to walk, stiffness in joints  . Fenofibrate     Leg Pain  . Niacin     Hot flashes  . Peanut-Containing Drug Products Itching    Eating a large of peanuts continuously. Same reaction with other tree nuts.  . Simvastatin     Pain to walk and joint stiffness in legs  . Soy Allergy Other (See Comments)    Digestive issues  . Wheat Bran     Digestive issues  . Aspirin Nausea Only  GI Upset, in high doses in uncoated aspirin   . Nitrofuran Derivatives     GI UPSET    Family History: Family History  Problem Relation Age of Onset  . Heart failure Mother   . Leukemia Father   . Hypertension Paternal Grandmother   . Heart attack Maternal Grandfather   . Breast cancer Sister   . Kidney disease Neg Hx   . Prostate cancer Neg Hx   . Kidney cancer Neg Hx   . Bladder Cancer Neg Hx     Social History:  reports that  has never smoked. he has never used smokeless tobacco. He reports that he drinks alcohol. He reports that he does not use drugs.  ROS: UROLOGY Frequent Urination?: No Hard to postpone urination?: Yes Burning/pain with urination?: Yes Get up at night to urinate?: No Leakage of urine?: Yes Urine stream starts and stops?: No Trouble starting stream?: No Do you have to  strain to urinate?: Yes Blood in urine?: No Urinary tract infection?: No Sexually transmitted disease?: No Injury to kidneys or bladder?: No Painful intercourse?: No Weak stream?: No Erection problems?: No Penile pain?: No  Gastrointestinal Nausea?: No Vomiting?: No Indigestion/heartburn?: No Diarrhea?: No Constipation?: No  Constitutional Fever: No Night sweats?: No Weight loss?: No Fatigue?: No  Skin Skin rash/lesions?: No Itching?: No  Eyes Blurred vision?: No Double vision?: No  Ears/Nose/Throat Sore throat?: No Sinus problems?: No  Hematologic/Lymphatic Swollen glands?: No Easy bruising?: No  Cardiovascular Leg swelling?: No Chest pain?: No  Respiratory Cough?: No Shortness of breath?: No  Endocrine Excessive thirst?: No  Musculoskeletal Back pain?: Yes Joint pain?: Yes  Neurological Headaches?: No Dizziness?: No  Psychologic Depression?: No Anxiety?: No  Physical Exam: BP (!) 164/70   Pulse 69   Ht 5\' 6"  (1.676 m)   Wt 154 lb (69.9 kg)   BMI 24.86 kg/m   Constitutional: Well nourished. Alert and oriented, No acute distress. HEENT: [place] AT, moist mucus membranes. Trachea midline, no masses. Cardiovascular: No clubbing, cyanosis, or edema. Respiratory: Normal respiratory effort, no increased work of breathing. Skin: No rashes, bruises or suspicious lesions. Lymph: No cervical or inguinal adenopathy. Neurologic: Grossly intact, no focal deficits, moving all 4 extremities. Psychiatric: Normal mood and affect.  Laboratory Data: PSA History  0.1 ng/mL on 10/23/2015  0.2 ng/mL on 06/24/2016  0.5 ng/mL on 12/24/2016  0.9 ng/mL on 07/14/2017  I have reviewed labs.    Pertinent imaging Results for Richard, Elliott (MRN [id number]) as of 08/24/2017 21:47  Ref. Range 08/21/2017 00:00  Scan Result Unknown 0    Assessment & Plan:    1. Rising PSA after prostate cancer treatment  - we will continue to monitor at this time as patient  has significant cardiac disease - if the PSA reaches 2.0 or greater will consider imaging studies  2. History of urinary retention  - Patient reports no further episodes of urinary retention. He reports his symptoms have significantly proved on finasteride and Flomax. He is tolerating the medications.  We will refill his prescriptions today.  3. History of prostate cancer- S/p cryoablation by Dr. Madelin Headings 2010.  Last PSA 0.5 ng/mL.  PSA nadir 0.3?  Recent PSA is 0.9  We will see him in 6 months for IPSS, PSA and exam.    4. Urge incontinence  - found Myrbetriq 25 mg helpful - would like to continue the medication - script sent to pharmacy   - RTC in three months for I PSS  and PVR  Return in about 3 months (around 11/18/2017) for IPSS and PVR.  Zara Council, [facility] [address], [place], [place] [address] [phone]

## 2017-08-21 ENCOUNTER — Encounter: Payer: Self-pay | Admitting: Urology

## 2017-08-21 ENCOUNTER — Ambulatory Visit: Payer: Medicare HMO | Admitting: Urology

## 2017-08-21 VITALS — BP 164/70 | HR 69 | Ht 66.0 in | Wt 154.0 lb

## 2017-08-21 DIAGNOSIS — N3941 Urge incontinence: Secondary | ICD-10-CM | POA: Diagnosis not present

## 2017-08-21 DIAGNOSIS — Z87898 Personal history of other specified conditions: Secondary | ICD-10-CM | POA: Diagnosis not present

## 2017-08-21 DIAGNOSIS — Z8546 Personal history of malignant neoplasm of prostate: Secondary | ICD-10-CM

## 2017-08-21 LAB — BLADDER SCAN AMB NON-IMAGING: SCAN RESULT: 0

## 2017-08-21 MED ORDER — MIRABEGRON ER 25 MG PO TB24: 25.0000 mg | ORAL_TABLET | Freq: Every day | ORAL | 12 refills | Status: AC

## 2017-11-18 ENCOUNTER — Ambulatory Visit: Payer: Medicare HMO | Admitting: Urology

## 2017-11-27 ENCOUNTER — Ambulatory Visit
Admission: EM | Admit: 2017-11-27 | Discharge: 2017-11-27 | Disposition: A | Discharge status: Home / Self Care | Payer: Medicare HMO | Attending: Emergency Medicine | Admitting: Emergency Medicine

## 2017-11-27 ENCOUNTER — Other Ambulatory Visit: Payer: Self-pay

## 2017-11-27 ENCOUNTER — Encounter: Payer: Self-pay | Admitting: Emergency Medicine

## 2017-11-27 DIAGNOSIS — K5900 Constipation, unspecified: Secondary | ICD-10-CM | POA: Diagnosis not present

## 2017-11-27 MED ORDER — GLYCERIN (ADULT) 2 G RE SUPP: 1.0000 | Freq: Once | RECTAL | 0 refills | Status: DC | PRN

## 2017-11-27 NOTE — Discharge Instructions (Addendum)
Increase your water intake.  You need to drink 62 ounces of water a day.  Try the glycerin suppositories.  This will help make the stool soft so that he can push it out easier.  In the future, try MiraLAX instead of mineral oil.  Go immediately to the ER for abdominal pain, nausea, vomiting, fevers above 100.4, or other concerns.

## 2017-11-27 NOTE — ED Provider Notes (Signed)
HPI  SUBJECTIVE:  Richard Elliott is a 84 y.o. male who presents with loose, runny stools starting today after taking some mineral oil, and a laxative/stool softener yesterday for constipation.   States his last normal bowel movement was 4 days ago.  He reports decreased amount of stool 3 days ago, stated that they were hard and "knotted", so he took a stool softener and tried mineral oil. Today he reports that he is leaking fecal matter.  He is able to push and tighten his anal sphincter, states that he feels as if there is a good amount of stool near his rectum and that his rectum will not open far enough to let the stool come out.  He denies fevers, abdominal pain, back pain, melena, hematochezia.  He states that he tries to drink 8 glasses of water a day but is not sure how much he actually does drink.  He has not tried anything else for his symptoms.  Symptoms are worse when he tries to push.  He has a past medical history of constipation since his mid 29s, he has a history of atrial flutter on Eliquis, prostate cancer.  No history of diabetes, abdominal surgeries, obstruction.  PMD: Ezequiel Kayser, MD   Past Medical History:  Diagnosis Date  . Allergic rhinitis 08/04/2014  . Aortic heart valve narrowing 11/05/2011   Overview:  a.  03/2010:  moderate.  33 mmHg peak grad 24 mmHg mean grad 1.3 cm2    . Arthritis or polyarthritis, rheumatoid ([place]) 11/05/2011   wrist, hands, hips, knees  . Atrial flutter ([place]) 04/16/2013   Overview:  10/14 hosp, felt to be secondary to choking episode.  TEE and DC CV.   . Bacterial urinary infection 11/05/2011   Overview:  Urinary tract infections/prostatitis.  Followed by a urologist in [place], [place].   Marland Kitchen Benign fibroma of prostate 11/05/2011   Overview:  a. Status post prostatic biopsy x 2 which have been negative.   . BP (high blood pressure) 11/05/2011  . CAD in native artery 08/04/2014   Overview:  a. Status post PCI of RCA x 2.  b. 4/00 Positive Myoview.  Catheterization revealed EF 70% with 3-vessel disease.  c. 11/01/98 CABG x 3 (by Dr. Ronnald Ramp), LIMA to LAD, SVG to RCA, SVG to ramus.  d. 7/00 catheterization, 100% LIMA, patent vein grafts.  e. 7/00 & 9/00 PCI of LAD.  f. 1/01 Redo CABG (by Dr. Corinna Capra), RIMA to distal LAD, SVG to D1, EF 65% at time of cardiac catheterization.     . Chronic otitis externa 11/05/2011  . Compressed spine fracture ([place]) 11/05/2011   Overview:  11/97 Lumbar collapsed vertebrae with impingement.    . Deafness, sensorineural 12/30/2012  . GERD (gastroesophageal reflux disease)   . Headache    enviornmental allergies  . Hearing aid worn    bilateral  . HLD (hyperlipidemia) 11/05/2011   Overview:  a. 2/07 Lipid profile:  TC 239, TG 232, HDL 35, LDL 158.  b. Many intolerances to Lipid medications.   Marland Kitchen HOH (hard of hearing)   . Mitral stenosis 12/28/2013   Overview:  Moderate per ECHO 10/14 with mean grad 4 and peak grad 59mmHg ALSO mention of mid LV gradient of 31mmHG   . Myocardial infarction [facility]) 1995 & 2017  . Prostate cancer [facility])     Past Surgical History:  Procedure Laterality Date  . A FLUTTER ABLATION  2012  . AORTIC VALVE REPLACEMENT  06/12/2016   [facility]  .  BALLOON DILATION Left 12/09/2016   Procedure: BALLOON DILATION OF LEFT EUSTACHIAN TUBE;  Surgeon: Margaretha Sheffield, MD;  Location: [facility] ORS;  Service: ENT;  Laterality: Left;  . CARDIAC CATHETERIZATION    . CARDIAC SURGERY  1991  . CORONARY ARTERY BYPASS GRAFT  1999 & 2000  . NASAL ENDOSCOPY Bilateral 12/09/2016   Procedure: NASAL ENDOSCOPY;  Surgeon: Margaretha Sheffield, MD;  Location: [facility] ORS;  Service: ENT;  Laterality: Bilateral;  . PROSTATE SURGERY      Family History  Problem Relation Age of Onset  . Heart failure Mother   . Leukemia Father   . Hypertension Paternal Grandmother   . Heart attack Maternal Grandfather   . Breast cancer Sister   . Kidney disease Neg Hx   . Prostate cancer Neg Hx   . Kidney cancer Neg Hx   . Bladder Cancer Neg Hx      Social History   Tobacco Use  . Smoking status: Never Smoker  . Smokeless tobacco: Never Used  Substance Use Topics  . Alcohol use: Yes    Alcohol/week: 0.0 oz    Comment: 1-3 drink/week  . Drug use: No    No current facility-administered medications for this encounter.   Current Outpatient Medications:  .  acetaminophen (TYLENOL) 650 MG CR tablet, Take 650 mg by mouth 2 (two) times daily as needed for pain., Disp: , Rfl:  .  aspirin 81 MG tablet, Take 81 mg by mouth daily. , Disp: , Rfl:  .  Biotin 1000 MCG tablet, Take 1,000 mcg by mouth 2 (two) times a week., Disp: , Rfl:  .  calcium carbonate (TUMS - DOSED IN MG ELEMENTAL CALCIUM) 500 MG chewable tablet, Chew 1 tablet by mouth daily as needed for indigestion or heartburn., Disp: , Rfl:  .  cetirizine (ZYRTEC) 10 MG tablet, Take 10 mg by mouth daily. , Disp: , Rfl:  .  CRANBERRY PO, Take 1 tablet by mouth daily as needed (urinary pain)., Disp: , Rfl:  .  ELIQUIS 2.5 MG TABS tablet, Take 2.5 mg by mouth 2 (two) times daily. , Disp: , Rfl:  .  FAMOTIDINE PO, Take by mouth., Disp: , Rfl:  .  finasteride (PROSCAR) 5 MG tablet, Take 1 tablet (5 mg total) by mouth daily., Disp: 90 tablet, Rfl: 3 .  fluticasone (FLONASE) 50 MCG/ACT nasal spray, Place 1 spray into the nose at bedtime. , Disp: , Rfl:  .  Glucosamine HCl (GLUCOSAMINE PO), Take 1 tablet by mouth daily., Disp: , Rfl:  .  Glucosamine-[place]-Vit C-Mn (GLUCOSAMINE 1500 COMPLEX) CAPS, Take by mouth., Disp: , Rfl:  .  losartan (COZAAR) 50 MG tablet, Take 50 mg by mouth daily. , Disp: , Rfl:  .  magnesium chloride (SLOW-MAG) 64 MG TBEC SR tablet, Take 1 tablet by mouth 2 (two) times daily. , Disp: , Rfl:  .  metoprolol tartrate (LOPRESSOR) 25 MG tablet, Take 25 mg by mouth 2 (two) times daily. , Disp: , Rfl:  .  mirabegron ER (MYRBETRIQ) 25 MG TB24 tablet, Take 1 tablet (25 mg total) by mouth daily., Disp: 30 tablet, Rfl: 12 .  montelukast (SINGULAIR) 10 MG tablet, Take 10  mg by mouth daily. , Disp: , Rfl:  .  nitroGLYCERIN (NITROSTAT) 0.4 MG SL tablet, Place 1 tablet under tongue as needed for chest pain (may repeat every 5 minutes but seek medical help if pain persists after 3 tablets) as needed, Disp: , Rfl:  .  omeprazole (PRILOSEC) 20 MG  capsule, Take 20 mg by mouth daily as needed (heartburn)., Disp: , Rfl:  .  polyethylene glycol (MIRALAX / GLYCOLAX) packet, Take 17 g by mouth daily as needed for mild constipation. , Disp: , Rfl:  .  Potassium 99 MG TABS, Take 99 mg by mouth daily as needed (nutritional supplementation)., Disp: , Rfl:  .  senna-docusate (SENOKOT-S) 8.6-50 MG tablet, Take 1 tablet by mouth at bedtime as needed for mild constipation. , Disp: , Rfl:  .  tamsulosin (FLOMAX) 0.4 MG CAPS capsule, Take 1 capsule (0.4 mg total) by mouth daily., Disp: 90 capsule, Rfl: 3 .  vitamin B-12 (CYANOCOBALAMIN) 1000 MCG tablet, Take 1,000 mcg by mouth 2 (two) times a week. , Disp: , Rfl:  .  glycerin adult 2 g suppository, Place 1 suppository rectally once as needed (constipation)., Disp: 12 suppository, Rfl: 0  Allergies  Allergen Reactions  . Eggs Or Egg-Derived Products Itching    Digestive issues and itching ONLY, per daughter  . Lisinopril     Cough  . Atorvastatin     Pain to walk, stiffness in joints  . Fenofibrate     Leg Pain  . Niacin     Hot flashes  . Peanut-Containing Drug Products Itching    Eating a large of peanuts continuously. Same reaction with other tree nuts.  . Simvastatin     Pain to walk and joint stiffness in legs  . Soy Allergy Other (See Comments)    Digestive issues  . Wheat Bran     Digestive issues  . Aspirin Nausea Only     GI Upset, in high doses in uncoated aspirin   . Nitrofuran Derivatives     GI UPSET     ROS  As noted in HPI.   Physical Exam  BP (!) 152/56 (BP Location: Left Arm)   Pulse 73   Temp 97.8 F (36.6 C) (Oral)   Resp 16   Ht 5\' 7"  (1.702 m)   Wt 166 lb (75.3 kg)   SpO2 97%   BMI  26.00 kg/m   Constitutional: Well developed, well nourished, no acute distress Eyes:  EOMI, conjunctiva normal bilaterally HENT: Normocephalic, atraumatic,mucus membranes moist Respiratory: Normal inspiratory effort Cardiovascular: Normal rate GI: Normal appearance, soft, nontender, nondistended, active bowel sounds, no guarding, rebound.  Negative tap table test. Rectal: Positive perirectal skin tags, no thrombosed hemorrhoids.  Normal rectal tone.  Firm and liquid normal colored stool in vault. skin: No rash, skin intact Musculoskeletal: no deformities Neurologic: Alert & oriented x 3, no focal neuro deficits Psychiatric: Speech and behavior appropriate   ED Course   Medications - No data to display  No orders of the defined types were placed in this encounter.   No results found for this or any previous visit (from the past 24 hour(s)). No results found.  ED Clinical Impression  Constipation, unspecified constipation type   ED Assessment/Plan  Suspect that patient is constipated and that he is having some liquid stool flow around the hard stool.  No evidence of bowel obstruction or surgical abdomen at this time.  Will send home with glycerin suppositories.  Advised MiraLAX instead of mineral oil in the future.  We will have him increase his water intake.  Follow-up with PMD as needed, to the ER for fevers, abdominal pain, if he gets worse.  Discussed MDM, treatment plan, and plan for follow-up with patient. Discussed sn/sx that should prompt return to the ED. patient agrees with plan.  Meds ordered this encounter  Medications  . glycerin adult 2 g suppository    Sig: Place 1 suppository rectally once as needed (constipation).    Dispense:  12 suppository    Refill:  0    *This clinic note was created using [employer]. Therefore, there may be occasional mistakes despite careful proofreading.   ?   Melynda Ripple, MD 11/27/17 [phone]

## 2017-11-27 NOTE — ED Triage Notes (Signed)
Patient in today stating he is having fecal incontinence. Patient states he was constipated and took some mineral oil last night. Patient's last normal BM was Tuesday (11/25/17). Patient didn't have BM on Wednesday, so he took mineral oil and now having fecal incontinence.

## 2017-12-15 NOTE — Progress Notes (Signed)
3:02 PM   Richard Elliott 01-23-1925 [id number]  Referring provider: Ezequiel Kayser, MD [facility] [place], [place] [address]  Chief Complaint  Patient presents with  . urge incontinence    HPI: Patient is a 84 year old Caucasian male with a history of urinary retention, prostate cancer and urge incontinence who presents today for follow-up.  History of urinary retention PVR is 0 mL.    Prostate cancer Patient is status post cryoablation by Dr. Madelin Headings in 2010.  PSA is stable at 0.9.    Urge incontinence He is on tamsulosin, finasteride and Myrbetriq.   His I PSS score today 12/3.  His PVR is 0 mL.  His BP is 146/70.  His previous IPSS score was 12/3.  His PVR is 0 mL.  His major complaints today are urgency, nocturia, incontinence and straining to urinate.  His UA was negative, but his specific gravity was < 1.005.   IPSS    Row Name 12/16/17 1500         International Prostate Symptom Score   How often have you had the sensation of not emptying your bladder?  Less than half the time     How often have you had to urinate less than every two hours?  Less than half the time     How often have you found you stopped and started again several times when you urinated?  Not at All     How often have you found it difficult to postpone urination?  About half the time     How often have you had a weak urinary stream?  About half the time     How often have you had to strain to start urination?  Not at All     How many times did you typically get up at night to urinate?  2 Times     Total IPSS Score  12       Quality of Life due to urinary symptoms   If you were to spend the rest of your life with your urinary condition just the way it is now how would you feel about that?  Mixed        Score:  1-7 Mild 8-19 Moderate 20-35 Severe   PMH: Past Medical History:  Diagnosis Date  . Allergic rhinitis 08/04/2014  . Aortic heart valve narrowing  11/05/2011   Overview:  a.  03/2010:  moderate.  33 mmHg peak grad 24 mmHg mean grad 1.3 cm2    . Arthritis or polyarthritis, rheumatoid ([place]) 11/05/2011   wrist, hands, hips, knees  . Atrial flutter ([place]) 04/16/2013   Overview:  10/14 hosp, felt to be secondary to choking episode.  TEE and DC CV.   . Bacterial urinary infection 11/05/2011   Overview:  Urinary tract infections/prostatitis.  Followed by a urologist in [place], [place].   Marland Kitchen Benign fibroma of prostate 11/05/2011   Overview:  a. Status post prostatic biopsy x 2 which have been negative.   . BP (high blood pressure) 11/05/2011  . CAD in native artery 08/04/2014   Overview:  a. Status post PCI of RCA x 2.  b. 4/00 Positive Myoview. Catheterization revealed EF 70% with 3-vessel disease.  c. 11/01/98 CABG x 3 (by Dr. Ronnald Ramp), LIMA to LAD, SVG to RCA, SVG to ramus.  d. 7/00 catheterization, 100% LIMA, patent vein grafts.  e. 7/00 & 9/00 PCI of LAD.  f. 1/01 Redo CABG (by Dr. Corinna Capra),  RIMA to distal LAD, SVG to D1, EF 65% at time of cardiac catheterization.     . Chronic otitis externa 11/05/2011  . Compressed spine fracture ([place]) 11/05/2011   Overview:  11/97 Lumbar collapsed vertebrae with impingement.    . Deafness, sensorineural 12/30/2012  . GERD (gastroesophageal reflux disease)   . Headache    enviornmental allergies  . Hearing aid worn    bilateral  . HLD (hyperlipidemia) 11/05/2011   Overview:  a. 2/07 Lipid profile:  TC 239, TG 232, HDL 35, LDL 158.  b. Many intolerances to Lipid medications.   Marland Kitchen HOH (hard of hearing)   . Mitral stenosis 12/28/2013   Overview:  Moderate per ECHO 10/14 with mean grad 4 and peak grad 108mmHg ALSO mention of mid LV gradient of 105mmHG   . Myocardial infarction [facility]) 1995 & 2017  . Prostate cancer [facility])     Surgical History: Past Surgical History:  Procedure Laterality Date  . A FLUTTER ABLATION  2012  . AORTIC VALVE REPLACEMENT  06/12/2016   [facility]  . BALLOON DILATION Left 12/09/2016   Procedure: BALLOON  DILATION OF LEFT EUSTACHIAN TUBE;  Surgeon: Margaretha Sheffield, MD;  Location: [facility] ORS;  Service: ENT;  Laterality: Left;  . CARDIAC CATHETERIZATION    . CARDIAC SURGERY  1991  . CORONARY ARTERY BYPASS GRAFT  1999 & 2000  . NASAL ENDOSCOPY Bilateral 12/09/2016   Procedure: NASAL ENDOSCOPY;  Surgeon: Margaretha Sheffield, MD;  Location: [facility] ORS;  Service: ENT;  Laterality: Bilateral;  . PROSTATE SURGERY      Home Medications:  Allergies as of 12/16/2017      Reactions   Eggs Or Egg-derived Products Itching   Digestive issues and itching ONLY, per daughter   Lisinopril    Cough   Atorvastatin    Pain to walk, stiffness in joints   Fenofibrate    Leg Pain   Niacin    Hot flashes   Peanut-containing Drug Products Itching   Eating a large of peanuts continuously. Same reaction with other tree nuts.   Simvastatin    Pain to walk and joint stiffness in legs   Soy Allergy Other (See Comments)   Digestive issues   Wheat Bran    Digestive issues   Aspirin Nausea Only    GI Upset, in high doses in uncoated aspirin    Nitrofuran Derivatives    GI UPSET      Medication List        Accurate as of 12/16/17 11:59 PM. Always use your most recent med list.          acetaminophen 650 MG CR tablet Commonly known as:  TYLENOL Take 650 mg by mouth 2 (two) times daily as needed for pain.   aspirin 81 MG tablet Take 81 mg by mouth daily.   Biotin 1000 MCG tablet Take 1,000 mcg by mouth 2 (two) times a week.   calcium carbonate 500 MG chewable tablet Commonly known as:  TUMS - dosed in mg elemental calcium Chew 1 tablet by mouth daily as needed for indigestion or heartburn.   cetirizine 10 MG tablet Commonly known as:  ZYRTEC Take 10 mg by mouth daily.   CRANBERRY PO Take 1 tablet by mouth daily as needed (urinary pain).   ELIQUIS 2.5 MG Tabs tablet Generic drug:  apixaban Take 2.5 mg by mouth 2 (two) times daily.   FAMOTIDINE PO Take by mouth.   finasteride 5 MG tablet Commonly  known as:  PROSCAR Take 1 tablet (5 mg total) by mouth daily.   fluticasone 50 MCG/ACT nasal spray Commonly known as:  FLONASE Place 1 spray into the nose at bedtime.   GLUCOSAMINE 1500 COMPLEX Caps Take by mouth.   GLUCOSAMINE PO Take 1 tablet by mouth daily.   glycerin adult 2 g suppository Place 1 suppository rectally once as needed (constipation).   losartan 50 MG tablet Commonly known as:  COZAAR Take 50 mg by mouth daily.   magnesium chloride 64 MG Tbec SR tablet Commonly known as:  SLOW-MAG Take 1 tablet by mouth 2 (two) times daily.   metoprolol tartrate 25 MG tablet Commonly known as:  LOPRESSOR Take 25 mg by mouth 2 (two) times daily.   mirabegron ER 25 MG Tb24 tablet Commonly known as:  MYRBETRIQ Take 1 tablet (25 mg total) by mouth daily.   montelukast 10 MG tablet Commonly known as:  SINGULAIR Take 10 mg by mouth daily.   nitroGLYCERIN 0.4 MG SL tablet Commonly known as:  NITROSTAT Place 1 tablet under tongue as needed for chest pain (may repeat every 5 minutes but seek medical help if pain persists after 3 tablets) as needed   omeprazole 20 MG capsule Commonly known as:  PRILOSEC Take 20 mg by mouth daily as needed (heartburn).   polyethylene glycol packet Commonly known as:  MIRALAX / GLYCOLAX Take 17 g by mouth daily as needed for mild constipation.   Potassium 99 MG Tabs Take 99 mg by mouth daily as needed (nutritional supplementation).   senna-docusate 8.6-50 MG tablet Commonly known as:  Senokot-S Take 1 tablet by mouth at bedtime as needed for mild constipation.   tamsulosin 0.4 MG Caps capsule Commonly known as:  FLOMAX Take 1 capsule (0.4 mg total) by mouth daily.   vitamin B-12 1000 MCG tablet Commonly known as:  CYANOCOBALAMIN Take 1,000 mcg by mouth 2 (two) times a week.       Allergies:  Allergies  Allergen Reactions  . Eggs Or Egg-Derived Products Itching    Digestive issues and itching ONLY, per daughter  . Lisinopril      Cough  . Atorvastatin     Pain to walk, stiffness in joints  . Fenofibrate     Leg Pain  . Niacin     Hot flashes  . Peanut-Containing Drug Products Itching    Eating a large of peanuts continuously. Same reaction with other tree nuts.  . Simvastatin     Pain to walk and joint stiffness in legs  . Soy Allergy Other (See Comments)    Digestive issues  . Wheat Bran     Digestive issues  . Aspirin Nausea Only     GI Upset, in high doses in uncoated aspirin   . Nitrofuran Derivatives     GI UPSET    Family History: Family History  Problem Relation Age of Onset  . Heart failure Mother   . Leukemia Father   . Hypertension Paternal Grandmother   . Heart attack Maternal Grandfather   . Breast cancer Sister   . Kidney disease Neg Hx   . Prostate cancer Neg Hx   . Kidney cancer Neg Hx   . Bladder Cancer Neg Hx     Social History:  reports that he has never smoked. He has never used smokeless tobacco. He reports that he drinks alcohol. He reports that he does not use drugs.  ROS: UROLOGY Frequent Urination?: No Hard to postpone urination?: No Burning/pain with urination?:  No Get up at night to urinate?: Yes Leakage of urine?: Yes Urine stream starts and stops?: No Trouble starting stream?: No Do you have to strain to urinate?: No Blood in urine?: No Urinary tract infection?: Yes Sexually transmitted disease?: No Injury to kidneys or bladder?: No Painful intercourse?: No Weak stream?: No Erection problems?: No Penile pain?: Yes  Gastrointestinal Nausea?: No Vomiting?: No Indigestion/heartburn?: No Diarrhea?: No Constipation?: No  Constitutional Fever: No Night sweats?: No Weight loss?: No Fatigue?: No  Skin Skin rash/lesions?: No Itching?: No  Eyes Blurred vision?: No Double vision?: No  Ears/Nose/Throat Sore throat?: No Sinus problems?: Yes  Hematologic/Lymphatic Swollen glands?: No Easy bruising?: No  Cardiovascular Leg swelling?:  No Chest pain?: No  Respiratory Cough?: No Shortness of breath?: Yes  Endocrine Excessive thirst?: No  Musculoskeletal Back pain?: Yes Joint pain?: Yes  Neurological Headaches?: No Dizziness?: No     Physical Exam: BP (!) 152/66   Pulse 67   Ht 5\' 7"  (1.702 m)   Wt 169 lb 6.4 oz (76.8 kg)   SpO2 96%   BMI 26.53 kg/m   Constitutional: Well nourished. Alert and oriented, No acute distress. HEENT: [place] AT, moist mucus membranes. Trachea midline, no masses. Cardiovascular: No clubbing, cyanosis, or edema. Respiratory: Normal respiratory effort, no increased work of breathing. Skin: No rashes, bruises or suspicious lesions. Lymph: No cervical or inguinal adenopathy. Neurologic: Grossly intact, no focal deficits, moving all 4 extremities. Psychiatric: Normal mood and affect.  Laboratory Data: PSA History  0.1 ng/mL on 10/23/2015  0.2 ng/mL on 06/24/2016  0.5 ng/mL on 12/24/2016  0.9 ng/mL on 07/14/2017  0.9 ng/mL on 12/16/2017  I have reviewed labs.    Pertinent imaging Results for Richard Elliott, Richard Elliott (MRN [id number]) as of 12/16/2017 16:35  Ref. Range 12/16/2017 16:11  Scan Result Unknown 0     Assessment & Plan:    1. Rising PSA after prostate cancer treatment We will continue to monitor at this time as patient has significant cardiac disease, if PSA reaches 2.0 or greater we will consider imaging studies  2. History of urinary retention PVR is 0 mL.   Continue finasteride 5 mg daily and tamsulosin 0.4 mg daily  3. History of prostate cancer Status post cryoablation by Dr. Madelin Headings in 2010.  Recent PSA is 0.9.   4. Urge incontinence Continue Myrbetriq  5. Low specific gravity Will check BMP  No follow-ups on file.  Zara Council, PA-C  [facility] [address] [place], [place] [address] [phone]

## 2017-12-16 ENCOUNTER — Encounter: Payer: Self-pay | Admitting: Urology

## 2017-12-16 ENCOUNTER — Ambulatory Visit: Payer: Medicare HMO | Admitting: Urology

## 2017-12-16 ENCOUNTER — Telehealth: Payer: Self-pay | Admitting: Urology

## 2017-12-16 VITALS — BP 152/66 | HR 67 | Ht 67.0 in | Wt 169.4 lb

## 2017-12-16 DIAGNOSIS — R9721 Rising PSA following treatment for malignant neoplasm of prostate: Secondary | ICD-10-CM | POA: Diagnosis not present

## 2017-12-16 DIAGNOSIS — N401 Enlarged prostate with lower urinary tract symptoms: Secondary | ICD-10-CM | POA: Diagnosis not present

## 2017-12-16 DIAGNOSIS — N3941 Urge incontinence: Secondary | ICD-10-CM | POA: Diagnosis not present

## 2017-12-16 LAB — BLADDER SCAN AMB NON-IMAGING: SCAN RESULT: 0

## 2017-12-16 NOTE — Telephone Encounter (Signed)
At check out, Richard Elliott request that message left for his results to be repeated twice, clearly, loudly and slowly so that he can understand his message. Thanks.

## 2017-12-17 LAB — URINALYSIS, COMPLETE
BILIRUBIN UA: NEGATIVE
GLUCOSE, UA: NEGATIVE
Ketones, UA: NEGATIVE
LEUKOCYTES UA: NEGATIVE
Nitrite, UA: NEGATIVE
PROTEIN UA: NEGATIVE
RBC UA: NEGATIVE
Specific Gravity, UA: <1.005 — ABNORMAL LOW (ref 1.005–1.030)
Urobilinogen, Ur: 0.2 mg/dL (ref 0.2–1.0)
pH, UA: 5.5 (ref 5.0–7.5)

## 2017-12-17 LAB — MICROSCOPIC EXAMINATION
RBC, UA: NONE SEEN /hpf (ref 0–2)
WBC, UA: NONE SEEN /hpf (ref 0–5)

## 2017-12-17 LAB — PSA: PROSTATE SPECIFIC AG, SERUM: 0.9 ng/mL (ref 0.0–4.0)

## 2017-12-18 ENCOUNTER — Telehealth: Payer: Self-pay | Admitting: Urology

## 2017-12-18 NOTE — Telephone Encounter (Signed)
I called [facility] earlier today to request that BMP be added on.

## 2017-12-18 NOTE — Telephone Encounter (Signed)
Patient made aware of his PSA results   Sharyn Lull

## 2017-12-18 NOTE — Telephone Encounter (Signed)
-----   Message from Nori Riis, PA-C sent at 12/17/2017  9:46 AM EDT ----- Please let Mr. Cheese know that his PSA is stable at 0.9.  Would you add a BMP to his PSA from yesterday?

## 2017-12-18 NOTE — Telephone Encounter (Signed)
Was his BMP added to his PSA?

## 2017-12-19 ENCOUNTER — Other Ambulatory Visit: Payer: Self-pay | Admitting: Urology

## 2017-12-19 ENCOUNTER — Telehealth: Payer: Self-pay

## 2017-12-19 DIAGNOSIS — R7989 Other specified abnormal findings of blood chemistry: Secondary | ICD-10-CM

## 2017-12-19 NOTE — Telephone Encounter (Signed)
-----   Message from Nori Riis, PA-C sent at 12/19/2017  8:03 AM EDT ----- Please let Mr. Gaiser know that his kidney function test has gone up and I would like to get an ultrasound to make sure his kidneys are okay.

## 2017-12-19 NOTE — Telephone Encounter (Signed)
lmom for pt to call office

## 2017-12-19 NOTE — Progress Notes (Signed)
RUS order is in

## 2017-12-19 NOTE — Telephone Encounter (Signed)
Patient notified and will wait on RUS appointment

## 2017-12-23 LAB — SPECIMEN STATUS REPORT

## 2017-12-23 LAB — BASIC METABOLIC PANEL
BUN/Creatinine Ratio: 23 (ref 10–24)
BUN: 29 mg/dL (ref 10–36)
CALCIUM: 9.8 mg/dL (ref 8.6–10.2)
CHLORIDE: 102 mmol/L (ref 96–106)
CO2: 14 mmol/L — AB (ref 20–29)
Creatinine, Ser: 1.27 mg/dL (ref 0.76–1.27)
GFR calc Af Amer: 57 mL/min/{1.73_m2} — ABNORMAL LOW (ref >59)
GFR, EST NON AFRICAN AMERICAN: 49 mL/min/{1.73_m2} — AB (ref >59)
GLUCOSE: 132 mg/dL — AB (ref 65–99)
Potassium: 5 mmol/L (ref 3.5–5.2)
Sodium: 136 mmol/L (ref 134–144)

## 2017-12-25 NOTE — Telephone Encounter (Signed)
Scheduling called to schedule his RUS and he told them that he was feeling better and didn't want to schedule. What do you want to do?  Sharyn Lull

## 2018-01-07 NOTE — Telephone Encounter (Signed)
That is fine to hold the RUS at this time.

## 2018-01-09 ENCOUNTER — Other Ambulatory Visit: Payer: Self-pay | Admitting: Urology

## 2018-01-09 DIAGNOSIS — Z87898 Personal history of other specified conditions: Secondary | ICD-10-CM

## 2019-01-26 ENCOUNTER — Other Ambulatory Visit: Payer: Self-pay | Admitting: Otolaryngology

## 2019-01-26 ENCOUNTER — Other Ambulatory Visit (HOSPITAL_COMMUNITY): Payer: Self-pay | Admitting: Otolaryngology

## 2019-01-26 DIAGNOSIS — H9212 Otorrhea, left ear: Secondary | ICD-10-CM

## 2019-01-28 ENCOUNTER — Other Ambulatory Visit: Payer: Self-pay

## 2019-01-28 ENCOUNTER — Ambulatory Visit
Admission: RE | Admit: 2019-01-28 | Discharge: 2019-01-28 | Disposition: A | Discharge status: Home / Self Care | Payer: Medicare HMO | Source: Ambulatory Visit | Attending: Otolaryngology | Admitting: Otolaryngology

## 2019-01-28 DIAGNOSIS — H9212 Otorrhea, left ear: Secondary | ICD-10-CM

## 2019-05-13 ENCOUNTER — Other Ambulatory Visit: Payer: Self-pay | Admitting: Urology

## 2019-05-13 DIAGNOSIS — Z87898 Personal history of other specified conditions: Secondary | ICD-10-CM

## 2019-06-14 ENCOUNTER — Other Ambulatory Visit: Payer: Self-pay | Admitting: Acute Care

## 2019-06-14 DIAGNOSIS — R42 Dizziness and giddiness: Secondary | ICD-10-CM

## 2019-06-16 ENCOUNTER — Ambulatory Visit: Payer: Medicare HMO

## 2019-06-25 ENCOUNTER — Encounter: Payer: Self-pay | Admitting: Radiology

## 2019-06-25 ENCOUNTER — Ambulatory Visit
Admission: RE | Admit: 2019-06-25 | Discharge: 2019-06-25 | Disposition: A | Discharge status: Home / Self Care | Payer: Medicare HMO | Source: Ambulatory Visit | Attending: Acute Care | Admitting: Acute Care

## 2019-06-25 ENCOUNTER — Other Ambulatory Visit: Payer: Self-pay

## 2019-06-25 DIAGNOSIS — R42 Dizziness and giddiness: Secondary | ICD-10-CM | POA: Diagnosis present

## 2019-07-17 ENCOUNTER — Inpatient Hospital Stay
Admission: EM | Admit: 2019-07-17 | Discharge: 2019-08-09 | DRG: 208 | Disposition: E | Payer: Medicare HMO | Attending: Internal Medicine | Admitting: Internal Medicine

## 2019-07-17 ENCOUNTER — Encounter: Payer: Self-pay | Admitting: Emergency Medicine

## 2019-07-17 ENCOUNTER — Other Ambulatory Visit: Payer: Self-pay

## 2019-07-17 ENCOUNTER — Emergency Department: Payer: Medicare HMO

## 2019-07-17 DIAGNOSIS — I2510 Atherosclerotic heart disease of native coronary artery without angina pectoris: Secondary | ICD-10-CM

## 2019-07-17 DIAGNOSIS — M069 Rheumatoid arthritis, unspecified: Secondary | ICD-10-CM | POA: Diagnosis present

## 2019-07-17 DIAGNOSIS — Z91018 Allergy to other foods: Secondary | ICD-10-CM

## 2019-07-17 DIAGNOSIS — I5043 Acute on chronic combined systolic (congestive) and diastolic (congestive) heart failure: Secondary | ICD-10-CM | POA: Diagnosis present

## 2019-07-17 DIAGNOSIS — I214 Non-ST elevation (NSTEMI) myocardial infarction: Secondary | ICD-10-CM | POA: Diagnosis present

## 2019-07-17 DIAGNOSIS — G9341 Metabolic encephalopathy: Secondary | ICD-10-CM | POA: Diagnosis present

## 2019-07-17 DIAGNOSIS — I255 Ischemic cardiomyopathy: Secondary | ICD-10-CM | POA: Diagnosis present

## 2019-07-17 DIAGNOSIS — J690 Pneumonitis due to inhalation of food and vomit: Secondary | ICD-10-CM | POA: Diagnosis present

## 2019-07-17 DIAGNOSIS — Z974 Presence of external hearing-aid: Secondary | ICD-10-CM

## 2019-07-17 DIAGNOSIS — Z515 Encounter for palliative care: Secondary | ICD-10-CM | POA: Diagnosis present

## 2019-07-17 DIAGNOSIS — J9601 Acute respiratory failure with hypoxia: Secondary | ICD-10-CM | POA: Diagnosis present

## 2019-07-17 DIAGNOSIS — N179 Acute kidney failure, unspecified: Secondary | ICD-10-CM | POA: Diagnosis not present

## 2019-07-17 DIAGNOSIS — J309 Allergic rhinitis, unspecified: Secondary | ICD-10-CM | POA: Diagnosis present

## 2019-07-17 DIAGNOSIS — I248 Other forms of acute ischemic heart disease: Secondary | ICD-10-CM | POA: Diagnosis present

## 2019-07-17 DIAGNOSIS — I130 Hypertensive heart and chronic kidney disease with heart failure and stage 1 through stage 4 chronic kidney disease, or unspecified chronic kidney disease: Secondary | ICD-10-CM | POA: Diagnosis present

## 2019-07-17 DIAGNOSIS — Z91010 Allergy to peanuts: Secondary | ICD-10-CM

## 2019-07-17 DIAGNOSIS — T380X5A Adverse effect of glucocorticoids and synthetic analogues, initial encounter: Secondary | ICD-10-CM | POA: Diagnosis present

## 2019-07-17 DIAGNOSIS — Z886 Allergy status to analgesic agent status: Secondary | ICD-10-CM

## 2019-07-17 DIAGNOSIS — R570 Cardiogenic shock: Secondary | ICD-10-CM | POA: Diagnosis not present

## 2019-07-17 DIAGNOSIS — Z87440 Personal history of urinary (tract) infections: Secondary | ICD-10-CM

## 2019-07-17 DIAGNOSIS — Z91012 Allergy to eggs: Secondary | ICD-10-CM

## 2019-07-17 DIAGNOSIS — H9190 Unspecified hearing loss, unspecified ear: Secondary | ICD-10-CM | POA: Diagnosis present

## 2019-07-17 DIAGNOSIS — Z888 Allergy status to other drugs, medicaments and biological substances status: Secondary | ICD-10-CM

## 2019-07-17 DIAGNOSIS — R410 Disorientation, unspecified: Secondary | ICD-10-CM

## 2019-07-17 DIAGNOSIS — R6521 Severe sepsis with septic shock: Secondary | ICD-10-CM | POA: Diagnosis not present

## 2019-07-17 DIAGNOSIS — U071 COVID-19: Secondary | ICD-10-CM | POA: Diagnosis present

## 2019-07-17 DIAGNOSIS — E785 Hyperlipidemia, unspecified: Secondary | ICD-10-CM | POA: Diagnosis present

## 2019-07-17 DIAGNOSIS — Z79899 Other long term (current) drug therapy: Secondary | ICD-10-CM

## 2019-07-17 DIAGNOSIS — I447 Left bundle-branch block, unspecified: Secondary | ICD-10-CM | POA: Diagnosis present

## 2019-07-17 DIAGNOSIS — R042 Hemoptysis: Secondary | ICD-10-CM | POA: Diagnosis present

## 2019-07-17 DIAGNOSIS — Z9102 Food additives allergy status: Secondary | ICD-10-CM

## 2019-07-17 DIAGNOSIS — Z8546 Personal history of malignant neoplasm of prostate: Secondary | ICD-10-CM

## 2019-07-17 DIAGNOSIS — Z978 Presence of other specified devices: Secondary | ICD-10-CM

## 2019-07-17 DIAGNOSIS — R531 Weakness: Secondary | ICD-10-CM

## 2019-07-17 DIAGNOSIS — Z8249 Family history of ischemic heart disease and other diseases of the circulatory system: Secondary | ICD-10-CM

## 2019-07-17 DIAGNOSIS — Z7982 Long term (current) use of aspirin: Secondary | ICD-10-CM

## 2019-07-17 DIAGNOSIS — I4891 Unspecified atrial fibrillation: Secondary | ICD-10-CM

## 2019-07-17 DIAGNOSIS — Z66 Do not resuscitate: Secondary | ICD-10-CM | POA: Diagnosis present

## 2019-07-17 DIAGNOSIS — K219 Gastro-esophageal reflux disease without esophagitis: Secondary | ICD-10-CM | POA: Diagnosis present

## 2019-07-17 DIAGNOSIS — I4892 Unspecified atrial flutter: Secondary | ICD-10-CM | POA: Diagnosis present

## 2019-07-17 DIAGNOSIS — Z8673 Personal history of transient ischemic attack (TIA), and cerebral infarction without residual deficits: Secondary | ICD-10-CM

## 2019-07-17 DIAGNOSIS — Z951 Presence of aortocoronary bypass graft: Secondary | ICD-10-CM

## 2019-07-17 DIAGNOSIS — C61 Malignant neoplasm of prostate: Secondary | ICD-10-CM | POA: Diagnosis present

## 2019-07-17 DIAGNOSIS — Z7901 Long term (current) use of anticoagulants: Secondary | ICD-10-CM

## 2019-07-17 DIAGNOSIS — I252 Old myocardial infarction: Secondary | ICD-10-CM

## 2019-07-17 DIAGNOSIS — Z952 Presence of prosthetic heart valve: Secondary | ICD-10-CM

## 2019-07-17 LAB — CBC WITH DIFFERENTIAL/PLATELET
Abs Immature Granulocytes: 0.02 10*3/uL (ref 0.00–0.07)
Basophils Absolute: 0 10*3/uL (ref 0.0–0.1)
Basophils Relative: 0 %
Eosinophils Absolute: 0 10*3/uL (ref 0.0–0.5)
Eosinophils Relative: 0 %
HCT: 36.3 % — ABNORMAL LOW (ref 39.0–52.0)
Hemoglobin: 11.9 g/dL — ABNORMAL LOW (ref 13.0–17.0)
Immature Granulocytes: 0 %
Lymphocytes Relative: 16 %
Lymphs Abs: 1.1 10*3/uL (ref 0.7–4.0)
MCH: 31.6 pg (ref 26.0–34.0)
MCHC: 32.8 g/dL (ref 30.0–36.0)
MCV: 96.5 fL (ref 80.0–100.0)
Monocytes Absolute: 0.5 10*3/uL (ref 0.1–1.0)
Monocytes Relative: 7 %
Neutro Abs: 5 10*3/uL (ref 1.7–7.7)
Neutrophils Relative %: 77 %
Platelets: 202 10*3/uL (ref 150–400)
RBC: 3.76 MIL/uL — ABNORMAL LOW (ref 4.22–5.81)
RDW: 13.6 % (ref 11.5–15.5)
WBC: 6.6 10*3/uL (ref 4.0–10.5)
nRBC: 0 % (ref 0.0–0.2)

## 2019-07-17 LAB — COMPREHENSIVE METABOLIC PANEL
ALT: 21 U/L (ref 0–44)
AST: 47 U/L — ABNORMAL HIGH (ref 15–41)
Albumin: 4.1 g/dL (ref 3.5–5.0)
Alkaline Phosphatase: 43 U/L (ref 38–126)
Anion gap: 14 (ref 5–15)
BUN: 26 mg/dL — ABNORMAL HIGH (ref 8–23)
CO2: 21 mmol/L — ABNORMAL LOW (ref 22–32)
Calcium: 9.5 mg/dL (ref 8.9–10.3)
Chloride: 104 mmol/L (ref 98–111)
Creatinine, Ser: 1.32 mg/dL — ABNORMAL HIGH (ref 0.61–1.24)
GFR calc Af Amer: 54 mL/min — ABNORMAL LOW (ref >60)
GFR calc non Af Amer: 47 mL/min — ABNORMAL LOW (ref >60)
Glucose, Bld: 130 mg/dL — ABNORMAL HIGH (ref 70–99)
Potassium: 3.8 mmol/L (ref 3.5–5.1)
Sodium: 139 mmol/L (ref 135–145)
Total Bilirubin: 1.3 mg/dL — ABNORMAL HIGH (ref 0.3–1.2)
Total Protein: 7.9 g/dL (ref 6.5–8.1)

## 2019-07-17 LAB — BRAIN NATRIURETIC PEPTIDE: B Natriuretic Peptide: 252 pg/mL — ABNORMAL HIGH (ref 0.0–100.0)

## 2019-07-17 LAB — PROTIME-INR
INR: 1 (ref 0.8–1.2)
Prothrombin Time: 13.3 seconds (ref 11.4–15.2)

## 2019-07-17 LAB — APTT: aPTT: 48 seconds — ABNORMAL HIGH (ref 24–36)

## 2019-07-17 LAB — HEPARIN LEVEL (UNFRACTIONATED): Heparin Unfractionated: <0.1 IU/mL — ABNORMAL LOW (ref 0.30–0.70)

## 2019-07-17 LAB — TROPONIN I (HIGH SENSITIVITY): Troponin I (High Sensitivity): 611 ng/L (ref <18)

## 2019-07-17 MED ORDER — VITAMIN B-12 1000 MCG PO TABS
1000.0000 ug | ORAL_TABLET | ORAL | Status: DC
  Filled 2019-07-17: qty 1

## 2019-07-17 MED ORDER — PANTOPRAZOLE SODIUM 40 MG PO TBEC
40.0000 mg | DELAYED_RELEASE_TABLET | Freq: Every day | ORAL | Status: DC
  Administered 2019-07-17 – 2019-07-18 (×2): 40 mg via ORAL
  Filled 2019-07-17 (×2): qty 1

## 2019-07-17 MED ORDER — SODIUM CHLORIDE 0.45 % IV SOLN: INTRAVENOUS | Status: DC

## 2019-07-17 MED ORDER — SENNOSIDES-DOCUSATE SODIUM 8.6-50 MG PO TABS: 1.0000 | ORAL_TABLET | Freq: Every evening | ORAL | Status: DC | PRN

## 2019-07-17 MED ORDER — ASPIRIN EC 81 MG PO TBEC
81.0000 mg | DELAYED_RELEASE_TABLET | Freq: Every day | ORAL | Status: DC
  Administered 2019-07-18: 81 mg via ORAL
  Filled 2019-07-17: qty 1

## 2019-07-17 MED ORDER — HEPARIN BOLUS VIA INFUSION
4000.0000 [IU] | Freq: Once | INTRAVENOUS | Status: AC
  Administered 2019-07-17: 4000 [IU] via INTRAVENOUS
  Filled 2019-07-17: qty 4000

## 2019-07-17 MED ORDER — MAGNESIUM CHLORIDE 64 MG PO TBEC
1.0000 | DELAYED_RELEASE_TABLET | Freq: Two times a day (BID) | ORAL | Status: DC
  Administered 2019-07-18: 64 mg via ORAL
  Filled 2019-07-17 (×2): qty 1

## 2019-07-17 MED ORDER — ONDANSETRON HCL 4 MG/2ML IJ SOLN: 4.0000 mg | Freq: Four times a day (QID) | INTRAMUSCULAR | Status: DC | PRN

## 2019-07-17 MED ORDER — MIRABEGRON ER 25 MG PO TB24
25.0000 mg | ORAL_TABLET | Freq: Every day | ORAL | Status: DC
  Administered 2019-07-18: 25 mg via ORAL
  Filled 2019-07-17: qty 1

## 2019-07-17 MED ORDER — POLYETHYLENE GLYCOL 3350 17 G PO PACK: 17.0000 g | PACK | Freq: Every day | ORAL | Status: DC | PRN

## 2019-07-17 MED ORDER — POTASSIUM 99 MG PO TABS: 99.0000 mg | ORAL_TABLET | Freq: Every day | ORAL | Status: DC | PRN

## 2019-07-17 MED ORDER — FUROSEMIDE 40 MG PO TABS
40.0000 mg | ORAL_TABLET | Freq: Every day | ORAL | Status: DC
  Administered 2019-07-17 – 2019-07-18 (×2): 40 mg via ORAL
  Filled 2019-07-17 (×2): qty 1

## 2019-07-17 MED ORDER — METOPROLOL TARTRATE 5 MG/5ML IV SOLN
2.5000 mg | INTRAVENOUS | Status: DC | PRN
  Administered 2019-07-17: 2.5 mg via INTRAVENOUS
  Filled 2019-07-17: qty 5

## 2019-07-17 MED ORDER — FINASTERIDE 5 MG PO TABS
5.0000 mg | ORAL_TABLET | Freq: Every day | ORAL | Status: DC
  Administered 2019-07-18 – 2019-07-19 (×2): 5 mg via ORAL
  Filled 2019-07-17 (×2): qty 1

## 2019-07-17 MED ORDER — METOPROLOL TARTRATE 5 MG/5ML IV SOLN: 5.0000 mg | INTRAVENOUS | Status: DC | PRN

## 2019-07-17 MED ORDER — METOPROLOL TARTRATE 25 MG PO TABS: 25.0000 mg | ORAL_TABLET | Freq: Two times a day (BID) | ORAL | Status: DC

## 2019-07-17 MED ORDER — CALCIUM CARBONATE ANTACID 500 MG PO CHEW: 1.0000 | CHEWABLE_TABLET | Freq: Every day | ORAL | Status: DC | PRN

## 2019-07-17 MED ORDER — LOSARTAN POTASSIUM 50 MG PO TABS
50.0000 mg | ORAL_TABLET | Freq: Every day | ORAL | Status: DC
  Administered 2019-07-17: 50 mg via ORAL
  Filled 2019-07-17: qty 1

## 2019-07-17 MED ORDER — ACETAMINOPHEN 325 MG PO TABS: 650.0000 mg | ORAL_TABLET | ORAL | Status: DC | PRN

## 2019-07-17 MED ORDER — NITROGLYCERIN 0.4 MG SL SUBL: 0.4000 mg | SUBLINGUAL_TABLET | SUBLINGUAL | Status: DC | PRN

## 2019-07-17 MED ORDER — HEPARIN (PORCINE) 25000 UT/250ML-% IV SOLN
800.0000 [IU]/h | INTRAVENOUS | Status: DC
  Administered 2019-07-17 – 2019-07-18 (×2): 800 [IU]/h via INTRAVENOUS
  Filled 2019-07-17 (×2): qty 250

## 2019-07-17 MED ORDER — SODIUM CHLORIDE 0.9 % IV BOLUS
500.0000 mL | Freq: Once | INTRAVENOUS | Status: AC
  Administered 2019-07-17: 500 mL via INTRAVENOUS

## 2019-07-17 MED ORDER — TAMSULOSIN HCL 0.4 MG PO CAPS
0.4000 mg | ORAL_CAPSULE | Freq: Every day | ORAL | Status: DC
  Administered 2019-07-17 – 2019-07-18 (×2): 0.4 mg via ORAL
  Filled 2019-07-17 (×2): qty 1

## 2019-07-17 NOTE — H&P (Signed)
History and Physical    Richard Elliott [id number] DOB: Nov 26, 1924 DOA: 07/18/2019  PCP: Ezequiel Kayser, MD   Patient coming from: home  I have personally briefly reviewed patient's old medical records in [place]  Chief Complaint: Weakness, confusion  HPI: Richard Elliott is a 86 y.o. hearing impaired male with medical history significant for CAD status post CABG, history of atrial valve replacement, atrial flutter on Eliquis, and history of prostate cancer lives independently who presented to the emergency room by EMS after daughter noticed that he has been declining for the past month with increasing weakness over the past couple days associated with confusion.  History is limited due to confusion and is compiled mostly from ER records.  ED Course: On arrival to the emergency room mildly confused.  Temperature 99.5, blood pressure 126/70.  He was in rapid a flutter with a heart rate of 135, respirations 20 O2 sat 95% on room air.  EKG showed A. fib with RVR and new bundle branch block.  Troponin was elevated at 611 and BNP was 252.  Fattening was at baseline at 1.32.  White cell count normal.  Chest x-ray showed no acute disease, head CT no acute intracranial findings.  The emergency room provider spoke with cardiologist Dr. Fletcher Anon who was in agreement with starting heparin infusion for possible NSTEMI.  Hospitalist consulted for admission.  Review of Systems: Unreliable due to patient's confusion and hearing impairment  Past Medical History:  Diagnosis Date  . Allergic rhinitis 08/04/2014  . Aortic heart valve narrowing 11/05/2011   Overview:  a.  03/2010:  moderate.  33 mmHg peak grad 24 mmHg mean grad 1.3 cm2    . Arthritis or polyarthritis, rheumatoid ([place]) 11/05/2011   wrist, hands, hips, knees  . Atrial flutter ([place]) 04/16/2013   Overview:  10/14 hosp, felt to be secondary to choking episode.  TEE and DC CV.   . Bacterial urinary infection 11/05/2011   Overview:  Urinary tract  infections/prostatitis.  Followed by a urologist in [place], [place].   Marland Kitchen Benign fibroma of prostate 11/05/2011   Overview:  a. Status post prostatic biopsy x 2 which have been negative.   . BP (high blood pressure) 11/05/2011  . CAD in native artery 08/04/2014   Overview:  a. Status post PCI of RCA x 2.  b. 4/00 Positive Myoview. Catheterization revealed EF 70% with 3-vessel disease.  c. 11/01/98 CABG x 3 (by Dr. Ronnald Ramp), LIMA to LAD, SVG to RCA, SVG to ramus.  d. 7/00 catheterization, 100% LIMA, patent vein grafts.  e. 7/00 & 9/00 PCI of LAD.  f. 1/01 Redo CABG (by Dr. Corinna Capra), RIMA to distal LAD, SVG to D1, EF 65% at time of cardiac catheterization.     . Chronic otitis externa 11/05/2011  . Compressed spine fracture ([place]) 11/05/2011   Overview:  11/97 Lumbar collapsed vertebrae with impingement.    . Deafness, sensorineural 12/30/2012  . GERD (gastroesophageal reflux disease)   . Headache    enviornmental allergies  . Hearing aid worn    bilateral  . HLD (hyperlipidemia) 11/05/2011   Overview:  a. 2/07 Lipid profile:  TC 239, TG 232, HDL 35, LDL 158.  b. Many intolerances to Lipid medications.   Marland Kitchen HOH (hard of hearing)   . Mitral stenosis 12/28/2013   Overview:  Moderate per ECHO 10/14 with mean grad 4 and peak grad 22mmHg ALSO mention of mid LV gradient of 58mmHG   . Myocardial infarction [facility]) 1995 &  2017  . Prostate cancer [facility])     Past Surgical History:  Procedure Laterality Date  . A FLUTTER ABLATION  2012  . AORTIC VALVE REPLACEMENT  06/12/2016   [facility]  . BALLOON DILATION Left 12/09/2016   Procedure: BALLOON DILATION OF LEFT EUSTACHIAN TUBE;  Surgeon: Margaretha Sheffield, MD;  Location: [facility] ORS;  Service: ENT;  Laterality: Left;  . CARDIAC CATHETERIZATION    . CARDIAC SURGERY  1991  . CORONARY ARTERY BYPASS GRAFT  1999 & 2000  . NASAL ENDOSCOPY Bilateral 12/09/2016   Procedure: NASAL ENDOSCOPY;  Surgeon: Margaretha Sheffield, MD;  Location: [facility] ORS;  Service: ENT;  Laterality: Bilateral;  . PROSTATE  SURGERY       reports that he has never smoked. He has never used smokeless tobacco. He reports current alcohol use. He reports that he does not use drugs.  Allergies  Allergen Reactions  . Eggs Or Egg-Derived Products Itching    Digestive issues and itching ONLY, per daughter  . Lisinopril     Cough  . Atorvastatin     Pain to walk, stiffness in joints  . Fenofibrate     Leg Pain  . Niacin     Hot flashes  . Peanut-Containing Drug Products Itching    Eating a large of peanuts continuously. Same reaction with other tree nuts.  . Simvastatin     Pain to walk and joint stiffness in legs  . Soy Allergy Other (See Comments)    Digestive issues  . Wheat Bran     Digestive issues  . Aspirin Nausea Only     GI Upset, in high doses in uncoated aspirin   . Nitrofuran Derivatives     GI UPSET    Family History  Problem Relation Age of Onset  . Heart failure Mother   . Leukemia Father   . Hypertension Paternal Grandmother   . Heart attack Maternal Grandfather   . Breast cancer Sister   . Kidney disease Neg Hx   . Prostate cancer Neg Hx   . Kidney cancer Neg Hx   . Bladder Cancer Neg Hx      Prior to Admission medications   Medication Sig Start Date End Date Taking? Authorizing Provider  acetaminophen (TYLENOL) 650 MG CR tablet Take 650 mg by mouth 2 (two) times daily as needed for pain.   Yes [provider]  aspirin 81 MG tablet Take 81 mg by mouth daily.  08/22/08  Yes [provider]  Biotin 1000 MCG tablet Take 1,000 mcg by mouth 2 (two) times a week.   Yes [provider]  calcium carbonate (TUMS - DOSED IN MG ELEMENTAL CALCIUM) 500 MG chewable tablet Chew 1 tablet by mouth daily as needed for indigestion or heartburn.   Yes [provider]  cetirizine (ZYRTEC) 10 MG tablet Take 10 mg by mouth daily.    Yes [provider]  ELIQUIS 2.5 MG TABS tablet Take 2.5 mg by mouth 2 (two) times daily.  04/02/15  Yes [provider]  finasteride (PROSCAR) 5 MG tablet TAKE 1 TABLET BY MOUTH ONCE DAILY 01/18/18  Yes McGowan, Larene Beach A, PA-C  fluticasone (FLONASE) 50 MCG/ACT nasal spray Place 1 spray into the nose at bedtime.  11/16/10  Yes [provider]  furosemide (LASIX) 40 MG tablet Take 40 mg by mouth daily. 07/12/19  Yes [provider]  Glucosamine HCl (GLUCOSAMINE PO) Take 1 tablet by mouth daily.   Yes [provider]  losartan (COZAAR) 50 MG tablet Take 50 mg by mouth daily.  04/02/15  Yes [provider]  magnesium chloride (SLOW-MAG) 64 MG TBEC SR tablet Take 1 tablet by mouth 2 (two) times daily.    Yes [provider]  metoprolol tartrate (LOPRESSOR) 25 MG tablet Take 25 mg by mouth 2 (two) times daily.  01/04/15  Yes [provider]  mirabegron ER (MYRBETRIQ) 25 MG TB24 tablet Take 1 tablet (25 mg total) by mouth daily. 08/21/17  Yes McGowan, Larene Beach A, PA-C  montelukast (SINGULAIR) 10 MG tablet Take 10 mg by mouth daily.  04/02/15  Yes [provider]  nitroGLYCERIN (NITROSTAT) 0.4 MG SL tablet Place 1 tablet under tongue as needed for chest pain (may repeat every 5 minutes but seek medical help if pain persists after 3 tablets) as needed 08/22/08  Yes [provider]  omeprazole (PRILOSEC) 20 MG capsule Take 20 mg by mouth daily as needed (heartburn).   Yes [provider]  polyethylene glycol (MIRALAX / GLYCOLAX) packet Take 17 g by mouth daily as needed for mild constipation.    Yes [provider]  Potassium 99 MG TABS Take 99 mg by mouth daily as needed (nutritional supplementation).   Yes [provider]  senna-docusate (SENOKOT-S) 8.6-50 MG tablet Take 1 tablet by mouth at bedtime as needed for mild constipation.    Yes [provider]  tamsulosin (FLOMAX) 0.4 MG CAPS capsule TAKE 1 CAPSULE BY MOUTH ONCE DAILY 01/18/18  Yes McGowan, Larene Beach A, PA-C  vitamin B-12 (CYANOCOBALAMIN) 1000 MCG tablet Take 1,000  mcg by mouth 2 (two) times a week.    Yes [provider]  Vitamin D, Ergocalciferol, (DRISDOL) 1.25 MG (50000 UT) CAPS capsule Take 50,000 Units by mouth once a week. 07/08/19  Yes [provider]    Physical Exam: Vitals:   07/25/2019 1734 07/20/2019 1736  BP: 126/70   Pulse: (!) 135   Resp: 20   Temp: 99.5 F (37.5 C)   TempSrc: Oral   SpO2: 95%   Weight:  65.8 kg  Height:  5\' 8"  (1.727 m)     Vitals:   07/09/2019 1734 07/21/2019 1736  BP: 126/70   Pulse: (!) 135   Resp: 20   Temp: 99.5 F (37.5 C)   TempSrc: Oral   SpO2: 95%   Weight:  65.8 kg  Height:  5\' 8"  (1.727 m)    Constitutional: NAD, alert and oriented x 2 Eyes: PERRL, lids and conjunctivae normal ENMT: Mucous membranes are moist.  Neck: normal, supple, no masses, no thyromegaly Respiratory: clear to auscultation bilaterally, no wheezing, no crackles. Normal respiratory effort. No accessory muscle use. 2 Cardiovascular: irregular and rapid rate no murmurs / rubs / gallops. No extremity edema. 2+ pedal pulses. No carotid bruits.  Abdomen: no tenderness, no masses palpated. No hepatosplenomegaly. Bowel sounds positive.  Musculoskeletal: no clubbing / cyanosis. No joint deformity upper and lower extremities.  Skin: no rashes, lesions, ulcers.  Neurologic: No gross focal neurologic deficit. Psychiatric: Normal mood and affect.   Labs on Admission: I have personally reviewed following labs and imaging studies  CBC: Recent Labs  Lab 07/24/2019 1755  WBC 6.6  NEUTROABS 5.0  HGB 11.9*  HCT 36.3*  MCV 96.5  PLT 123XX123   Basic Metabolic Panel: Recent Labs  Lab 07/16/2019 1755  NA 139  K 3.8  CL 104  CO2 21*  GLUCOSE 130*  BUN 26*  CREATININE 1.32*  CALCIUM 9.5  GFR: Estimated Creatinine Clearance: 33.2 mL/min (A) (by C-G formula based on SCr of 1.32 mg/dL (H)). Liver Function Tests: Recent Labs  Lab 07/19/2019 1755  AST 47*  ALT 21  ALKPHOS 43  BILITOT 1.3*  PROT 7.9  ALBUMIN 4.1     No results for input(s): LIPASE, AMYLASE in the last 168 hours. No results for input(s): AMMONIA in the last 168 hours. Coagulation Profile: No results for input(s): INR, PROTIME in the last 168 hours. Cardiac Enzymes: No results for input(s): CKTOTAL, CKMB, CKMBINDEX, TROPONINI in the last 168 hours. BNP (last 3 results) No results for input(s): PROBNP in the last 8760 hours. HbA1C: No results for input(s): HGBA1C in the last 72 hours. CBG: No results for input(s): GLUCAP in the last 168 hours. Lipid Profile: No results for input(s): CHOL, HDL, LDLCALC, TRIG, CHOLHDL, LDLDIRECT in the last 72 hours. Thyroid Function Tests: No results for input(s): TSH, T4TOTAL, FREET4, T3FREE, THYROIDAB in the last 72 hours. Anemia Panel: No results for input(s): VITAMINB12, FOLATE, FERRITIN, TIBC, IRON, RETICCTPCT in the last 72 hours. Urine analysis:    Component Value Date/Time   COLORURINE YELLOW (A) 03/27/2016 0153   APPEARANCEUR Clear 12/16/2017 1626   LABSPEC 1.021 03/27/2016 0153   PHURINE 5.0 03/27/2016 0153   GLUCOSEU Negative 12/16/2017 1626   HGBUR NEGATIVE 03/27/2016 0153   BILIRUBINUR Negative 12/16/2017 1626   KETONESUR NEGATIVE 03/27/2016 0153   PROTEINUR Negative 12/16/2017 1626   PROTEINUR 30 (A) 03/27/2016 0153   NITRITE Negative 12/16/2017 1626   NITRITE NEGATIVE 03/27/2016 0153   LEUKOCYTESUR Negative 12/16/2017 1626    Radiological Exams on Admission: CT HEAD WO CONTRAST  Result Date: 07/20/2019 CLINICAL DATA:  Weakness and altered mental status EXAM: CT HEAD WITHOUT CONTRAST TECHNIQUE: Contiguous axial images were obtained from the base of the skull through the vertex without intravenous contrast. COMPARISON:  CT head dated 03/27/2016. FINDINGS: Brain: No evidence of acute infarction, hemorrhage, hydrocephalus, extra-axial collection or mass lesion/mass effect. Periventricular white matter hypoattenuation likely represents chronic small vessel ischemic disease.  Vascular: There are vascular calcifications in the carotid siphons. Skull: Normal. Negative for fracture or focal lesion. Sinuses/Orbits: The patient is status post a left mastoidectomy. Other: None. IMPRESSION: 1. No acute intracranial process. Electronically Signed   By: Zerita Boers M.D.   On: 07/10/2019 18:48   DG Chest Port 1 View  Result Date: 07/30/2019 CLINICAL DATA:  Altered mental status EXAM: PORTABLE CHEST 1 VIEW COMPARISON:  Chest radiograph dated 03/29/2016 FINDINGS: The heart is normal in size. Median sternotomy wires are redemonstrated. The lungs are hypoinflated. There is mild bibasilar atelectasis/airspace disease. There is no pleural effusion or pneumothorax. Degenerative changes are seen in both shoulders. IMPRESSION: 1. Low lung volumes with mild bibasilar atelectasis/airspace disease. Electronically Signed   By: Zerita Boers M.D.   On: 07/12/2019 19:16    EKG: Independently reviewed.   Assessment/Plan    NSTEMI (non-ST elevated myocardial infarction) ([place]) Coronary artery disease with history of CABG --suspect NSTEMI given new LBBB and elevated troponin --Elevated troponin may also be secondary to demand ischemia from a flutter with RVR --Continue to cycle troponins --Continue heparin drip started in the emergency room(hold home eliquis) --Continue aspirin, beta-blockers, statins --Cardiology consult made to Dr. Fletcher Anon  Acute metabolic encephalopathy --At baseline patient functions independently --Now confused and this is possibly due to ACS as outlined above.  No evidence of dehydration or acute infection, though did have a low-grade temperature of 99.3 --Follow-up UA for further evaluation. --  Follow-up Covid test --Neurochecks, fall and aspiration precautions    Atrial flutter with rapid ventricular response ([facility]) --Ventricular response rate of 135 on admission --IV metoprolol for Hr over 120 --plan to continue home metoprolol po once rate controlled --eliquis  on hold as patient on full dose heparin for NSTEMI    S/P TAVR (transcatheter aortic valve replacement) --No acute issues    Generalized weakness --Reported 1 month history of overall decline worse over the past 2 days --Chart review does reveal history of falls, had hospitalization for syncope in 2017 --Debility related to acute illness as outlined above, likely superimposed on chronic physical deconditioning --physical therapy evaluation --Fall precautions      DVT prophylaxis: on full dose heparin  Code Status: full  Family Communication: none  Disposition Plan: Back to previous home environment Consults called: cardiology, Dr Cecelia Byars MD [facility]     07/15/2019, 7:42 PM

## 2019-07-17 NOTE — ED Notes (Signed)
Daughter took bag w/ clothing home with her.Marland Kitchen

## 2019-07-17 NOTE — ED Notes (Signed)
Pt walked to the BR with one assist by this RN. Pt assisted with flushing toilet but pt was able to wipe himself with no assist. Pt assisted back into bed at this time. Daughter at bedside.

## 2019-07-17 NOTE — ED Provider Notes (Signed)
[facility] Emergency Department Provider Note  ____________________________________________   First MD Initiated Contact with Patient 07/12/2019 1739     (approximate)  I have reviewed the triage vital signs and the nursing notes.  History  Chief Complaint Altered Mental Status    HPI Richard Elliott is a 86 y.o. male with hx of AF on Eliquis, s/p TAVR, CAD, extremely hard of hearing, prostate cancer who presents for decreased responsiveness, confusion. History primarily obtained from daughter. Daughter states over the last several days, she has noticed the patient's appetite significantly decreased, not taking medications, not cleaning his dishes and home like he normally does. Normally lives independently, performs his own ADLs, and is alert and oriented x 3.   Daughter says he has not had any complaints of chest pain, abdominal pain, urinary pain.   Does have hx of UTI (hx of prostate cancer and issue with UTI since then).  Caveat: hx primarily obtained from daughter, EMS due to patient's AMS + HOH   Past Medical Hx Past Medical History:  Diagnosis Date  . Allergic rhinitis 08/04/2014  . Aortic heart valve narrowing 11/05/2011   Overview:  a.  03/2010:  moderate.  33 mmHg peak grad 24 mmHg mean grad 1.3 cm2    . Arthritis or polyarthritis, rheumatoid ([place]) 11/05/2011   wrist, hands, hips, knees  . Atrial flutter ([place]) 04/16/2013   Overview:  10/14 hosp, felt to be secondary to choking episode.  TEE and DC CV.   . Bacterial urinary infection 11/05/2011   Overview:  Urinary tract infections/prostatitis.  Followed by a urologist in [place], [place].   Marland Kitchen Benign fibroma of prostate 11/05/2011   Overview:  a. Status post prostatic biopsy x 2 which have been negative.   . BP (high blood pressure) 11/05/2011  . CAD in native artery 08/04/2014   Overview:  a. Status post PCI of RCA x 2.  b. 4/00 Positive Myoview. Catheterization revealed EF 70% with 3-vessel disease.  c.  11/01/98 CABG x 3 (by Dr. Ronnald Ramp), LIMA to LAD, SVG to RCA, SVG to ramus.  d. 7/00 catheterization, 100% LIMA, patent vein grafts.  e. 7/00 & 9/00 PCI of LAD.  f. 1/01 Redo CABG (by Dr. Corinna Capra), RIMA to distal LAD, SVG to D1, EF 65% at time of cardiac catheterization.     . Chronic otitis externa 11/05/2011  . Compressed spine fracture ([place]) 11/05/2011   Overview:  11/97 Lumbar collapsed vertebrae with impingement.    . Deafness, sensorineural 12/30/2012  . GERD (gastroesophageal reflux disease)   . Headache    enviornmental allergies  . Hearing aid worn    bilateral  . HLD (hyperlipidemia) 11/05/2011   Overview:  a. 2/07 Lipid profile:  TC 239, TG 232, HDL 35, LDL 158.  b. Many intolerances to Lipid medications.   Marland Kitchen HOH (hard of hearing)   . Mitral stenosis 12/28/2013   Overview:  Moderate per ECHO 10/14 with mean grad 4 and peak grad 37mmHg ALSO mention of mid LV gradient of 15mmHG   . Myocardial infarction [facility]) 1995 & 2017  . Prostate cancer [facility])     Problem List Patient Active Problem List   Diagnosis Date Noted  . S/P TAVR (transcatheter aortic valve replacement) 06/13/2016  . Unstable angina ([place]) 05/10/2016  . Impaired glucose tolerance 04/09/2016  . Atherosclerosis of both carotid arteries 04/05/2016  . Syncope 03/27/2016  . Atherosclerosis of abdominal aorta ([place]) 03/20/2016  . Allergic rhinitis 08/04/2014  . CAD  in native artery 08/04/2014  . Mitral stenosis 12/28/2013  . Atrial flutter with rapid ventricular response ([place]) 04/16/2013  . Deafness, sensorineural 12/30/2012  . Aortic heart valve narrowing 11/05/2011  . Benign prostatic hyperplasia 11/05/2011  . Chronic otitis externa 11/05/2011  . Compressed spine fracture ([place]) 11/05/2011  . Hyperlipidemia, unspecified 11/05/2011  . BP (high blood pressure) 11/05/2011  . Arthritis or polyarthritis, rheumatoid ([place]) 11/05/2011  . Bacterial urinary infection 11/05/2011  . Compression fracture of vertebral column ([place])  11/05/2011  . Rheumatoid arthritis ([place]) 11/05/2011    Past Surgical Hx Past Surgical History:  Procedure Laterality Date  . A FLUTTER ABLATION  2012  . AORTIC VALVE REPLACEMENT  06/12/2016   [facility]  . BALLOON DILATION Left 12/09/2016   Procedure: BALLOON DILATION OF LEFT EUSTACHIAN TUBE;  Surgeon: Margaretha Sheffield, MD;  Location: [facility] ORS;  Service: ENT;  Laterality: Left;  . CARDIAC CATHETERIZATION    . CARDIAC SURGERY  1991  . CORONARY ARTERY BYPASS GRAFT  1999 & 2000  . NASAL ENDOSCOPY Bilateral 12/09/2016   Procedure: NASAL ENDOSCOPY;  Surgeon: Margaretha Sheffield, MD;  Location: [facility] ORS;  Service: ENT;  Laterality: Bilateral;  . PROSTATE SURGERY      Medications Prior to Admission medications   Medication Sig Start Date End Date Taking? Authorizing Provider  acetaminophen (TYLENOL) 650 MG CR tablet Take 650 mg by mouth 2 (two) times daily as needed for pain.    [provider]  aspirin 81 MG tablet Take 81 mg by mouth daily.  08/22/08   [provider]  Biotin 1000 MCG tablet Take 1,000 mcg by mouth 2 (two) times a week.    [provider]  calcium carbonate (TUMS - DOSED IN MG ELEMENTAL CALCIUM) 500 MG chewable tablet Chew 1 tablet by mouth daily as needed for indigestion or heartburn.    [provider]  cetirizine (ZYRTEC) 10 MG tablet Take 10 mg by mouth daily.     [provider]  CRANBERRY PO Take 1 tablet by mouth daily as needed (urinary pain).    [provider]  ELIQUIS 2.5 MG TABS tablet Take 2.5 mg by mouth 2 (two) times daily.  04/02/15   [provider]  FAMOTIDINE PO Take by mouth.    [provider]  finasteride (PROSCAR) 5 MG tablet TAKE 1 TABLET BY MOUTH ONCE DAILY 01/18/18   Ernestine Conrad, Larene Beach A, PA-C  fluticasone (FLONASE) 50 MCG/ACT nasal spray Place 1 spray into the nose at bedtime.  11/16/10   [provider]  Glucosamine HCl (GLUCOSAMINE PO) Take 1 tablet by mouth daily.    [provider]  Glucosamine-Chondroit-Vit C-Mn (GLUCOSAMINE 1500 COMPLEX) CAPS Take by mouth.    [provider]  glycerin adult 2 g suppository Place 1 suppository rectally once as needed (constipation). 11/27/17   Melynda Ripple, MD  losartan (COZAAR) 50 MG tablet Take 50 mg by mouth daily.  04/02/15   [provider]  magnesium chloride (SLOW-MAG) 64 MG TBEC SR tablet Take 1 tablet by mouth 2 (two) times daily.     [provider]  metoprolol tartrate (LOPRESSOR) 25 MG tablet Take 25 mg by mouth 2 (two) times daily.  01/04/15   [provider]  mirabegron ER (MYRBETRIQ) 25 MG TB24 tablet Take 1 tablet (25 mg total) by mouth daily. 08/21/17   Zara Council A, PA-C  montelukast (SINGULAIR) 10 MG tablet Take 10 mg by mouth daily.  04/02/15   [provider]  nitroGLYCERIN (NITROSTAT) 0.4 MG SL tablet Place 1 tablet under tongue as needed for chest pain (may repeat every 5 minutes but seek medical help if pain persists after 3 tablets) as needed 08/22/08   [provider]  omeprazole (PRILOSEC) 20 MG capsule Take 20 mg by mouth daily as needed (heartburn).    [provider]  polyethylene glycol (MIRALAX / GLYCOLAX) packet Take 17 g by mouth daily as needed for mild constipation.     [provider]  Potassium 99 MG TABS Take 99 mg by mouth daily as needed (nutritional supplementation).    [provider]  senna-docusate (SENOKOT-S) 8.6-50 MG tablet Take 1 tablet by mouth at bedtime as needed for mild constipation.     [provider]  tamsulosin (FLOMAX) 0.4 MG CAPS capsule TAKE 1 CAPSULE BY MOUTH ONCE DAILY 01/18/18   Ernestine Conrad, Larene Beach A, PA-C  vitamin B-12 (CYANOCOBALAMIN) 1000 MCG tablet Take 1,000 mcg by mouth 2 (two) times a week.     [provider]    Allergies Eggs or egg-derived products, Lisinopril, Atorvastatin, Fenofibrate, Niacin, Peanut-containing drug products, Simvastatin, Soy allergy,  Wheat bran, Aspirin, and Nitrofuran derivatives  Family Hx Family History  Problem Relation Age of Onset  . Heart failure Mother   . Leukemia Father   . Hypertension Paternal Grandmother   . Heart attack Maternal Grandfather   . Breast cancer Sister   . Kidney disease Neg Hx   . Prostate cancer Neg Hx   . Kidney cancer Neg Hx   . Bladder Cancer Neg Hx     Social Hx Social History   Tobacco Use  . Smoking status: Never Smoker  . Smokeless tobacco: Never Used  Substance Use Topics  . Alcohol use: Yes    Alcohol/week: 0.0 standard drinks    Comment: 1-3 drink/week  . Drug use: No     Review of Systems Unable to obtain 2/2 patient's clinical status, AMS and hard of hearing   Physical Exam  Vital Signs: ED Triage Vitals  Enc Vitals Group     BP 07/09/2019 1734 126/70     Pulse Rate 08/03/2019 1734 (!) 135     Resp 07/09/2019 1734 20     Temp 08/01/2019 1734 99.5 F (37.5 C)     Temp Source 07/26/2019 1734 Oral     SpO2 07/19/2019 1734 95 %     Weight 08/08/2019 1736 145 lb (65.8 kg)     Height 08/04/2019 1736 5\' 8"  (1.727 m)     Head Circumference --      Peak Flow --      Pain Score --      Pain Loc --      Pain Edu? --      Excl. in [place]? --     Constitutional: Awake, extremely hard of hearing.  Head: Normocephalic. Atraumatic. Eyes: Conjunctivae clear. Sclera anicteric. Nose: No congestion. No rhinorrhea. Mouth/Throat: Wearing mask.  MM dry Neck: No stridor.   Cardiovascular: Tachycardic, irregularly irregular. Extremities well perfused. Respiratory: Normal respiratory effort.  Lungs CTAB. Gastrointestinal: Soft. Non-tender. Non-distended.  Musculoskeletal: No lower extremity edema. No deformities. Neurologic:  Normal speech and language. No gross focal neurologic deficits are appreciated.  Skin: Skin is warm, dry and intact. No rash noted. Psychiatric: Mood and affect are appropriate for situation.  EKG  Personally reviewed.   Rate: 130s Rhythm: suspect AF  though question possible P waves in V1 Axis: LAD Intervals: widened QRS 2/2 LBBB, prolonged QTc  LBBB new compared to prior    Radiology  CT head: IMPRESSION:  1. No acute intracranial process.   CXR: IMPRESSION:  1. Low lung volumes with mild bibasilar atelectasis/airspace  disease.    Procedures  Procedure(s) performed (including critical care):  .Critical Care Performed by: Lilia Pro., MD Authorized by: Lilia Pro., MD   Critical care provider statement:    Critical care time (minutes):  45   Critical care was necessary to treat or prevent imminent or life-threatening deterioration of the following conditions: NSTEMI.   Critical care was time spent personally by me on the following activities:  Discussions with consultants, evaluation of patient's response to treatment, examination of patient, ordering and performing treatments and interventions, ordering and review of laboratory studies, ordering and review of radiographic studies, pulse oximetry, re-evaluation of patient's condition, obtaining history from patient or surrogate and review of old charts     Initial Impression / Assessment and Plan / ED Course  86 y.o. male who presents to the ED for confusion/AMS above baseline.  As above.  Ddx: dehydration versus infection, such as UTI, pneumonia versus electrolyte abnormality versus intracranial abnormality, amongst others  EKG with new LBBB, no Sgar[place]ossa criteria. HS troponin elevated at 611. Discussed w/ cardiology Dr. Fletcher Anon, agree w/ treating as NSTEMI. Will initiate heparin drip, CT head confirmed negative.   Updated daughter on results, who is agreeable with plan of care. Discussed with hospitalist for admission. Awaiting urine studies.    Final Clinical Impression(s) / ED Diagnosis  Final diagnoses:  Confusion  NSTEMI (non-ST elevated myocardial infarction) [facility])       Note:  This document was prepared using Dragon voice recognition software  and may include unintentional dictation errors.   Lilia Pro., MD 07/09/2019 [phone]

## 2019-07-17 NOTE — Consult Note (Signed)
ANTICOAGULATION CONSULT NOTE - Initial Consult  Pharmacy Consult for heparin drip Indication: chest pain/ACS/NSTEMI  Allergies  Allergen Reactions  . Eggs Or Egg-Derived Products Itching    Digestive issues and itching ONLY, per daughter  . Lisinopril     Cough  . Atorvastatin     Pain to walk, stiffness in joints  . Fenofibrate     Leg Pain  . Niacin     Hot flashes  . Peanut-Containing Drug Products Itching    Eating a large of peanuts continuously. Same reaction with other tree nuts.  . Simvastatin     Pain to walk and joint stiffness in legs  . Soy Allergy Other (See Comments)    Digestive issues  . Wheat Bran     Digestive issues  . Aspirin Nausea Only     GI Upset, in high doses in uncoated aspirin   . Nitrofuran Derivatives     GI UPSET    Patient Measurements: Height: 5\' 8"  (172.7 cm) Weight: 145 lb (65.8 kg) IBW/kg (Calculated) : 68.4 Heparin Dosing Weight: 68.4kg  Vital Signs: Temp: 99.5 F (37.5 C) (01/09 1734) Temp Source: Oral (01/09 1734) BP: 126/70 (01/09 1734) Pulse Rate: 135 (01/09 1734)  Labs: Recent Labs    07/31/2019 1755  HGB 11.9*  HCT 36.3*  PLT 202  CREATININE 1.32*  TROPONINIHS 611*    Estimated Creatinine Clearance: 33.2 mL/min (A) (by C-G formula based on SCr of 1.32 mg/dL (H)).   Medical History: Past Medical History:  Diagnosis Date  . Allergic rhinitis 08/04/2014  . Aortic heart valve narrowing 11/05/2011   Overview:  a.  03/2010:  moderate.  33 mmHg peak grad 24 mmHg mean grad 1.3 cm2    . Arthritis or polyarthritis, rheumatoid ([place]) 11/05/2011   wrist, hands, hips, knees  . Atrial flutter ([place]) 04/16/2013   Overview:  10/14 hosp, felt to be secondary to choking episode.  TEE and DC CV.   . Bacterial urinary infection 11/05/2011   Overview:  Urinary tract infections/prostatitis.  Followed by a urologist in [place], [place].   Marland Kitchen Benign fibroma of prostate 11/05/2011   Overview:  a. Status post prostatic biopsy x 2 which have been  negative.   . BP (high blood pressure) 11/05/2011  . CAD in native artery 08/04/2014   Overview:  a. Status post PCI of RCA x 2.  b. 4/00 Positive Myoview. Catheterization revealed EF 70% with 3-vessel disease.  c. 11/01/98 CABG x 3 (by Dr. Ronnald Ramp), LIMA to LAD, SVG to RCA, SVG to ramus.  d. 7/00 catheterization, 100% LIMA, patent vein grafts.  e. 7/00 & 9/00 PCI of LAD.  f. 1/01 Redo CABG (by Dr. Corinna Capra), RIMA to distal LAD, SVG to D1, EF 65% at time of cardiac catheterization.     . Chronic otitis externa 11/05/2011  . Compressed spine fracture ([place]) 11/05/2011   Overview:  11/97 Lumbar collapsed vertebrae with impingement.    . Deafness, sensorineural 12/30/2012  . GERD (gastroesophageal reflux disease)   . Headache    enviornmental allergies  . Hearing aid worn    bilateral  . HLD (hyperlipidemia) 11/05/2011   Overview:  a. 2/07 Lipid profile:  TC 239, TG 232, HDL 35, LDL 158.  b. Many intolerances to Lipid medications.   Marland Kitchen HOH (hard of hearing)   . Mitral stenosis 12/28/2013   Overview:  Moderate per ECHO 10/14 with mean grad 4 and peak grad 6mmHg ALSO mention of mid LV gradient of 79mmHG   .  Myocardial infarction [facility]) 1995 & 2017  . Prostate cancer ([facility])     Medications:  PTA Eliquis 2.5mg  bid - per pt report, has not taken a dose today  Assessment: 86 y.o. male with hx of AF on Eliquis, s/p TAVR, CAD, extremely hard of hearing, prostate cancer who presents for decreased responsiveness, confusion.  Pharmacy has been consulted to initiate and monitor a heparin drip.    Goal of Therapy:  Heparin level 0.3-0.7 units/ml Monitor platelets by anticoagulation protocol: Yes   Plan:  Give 4000 units bolus x 1, followed by 800 units/hr  Will assess baseline labs including heparin level (HL), APTT, and INR.  CBC as above  Will recheck HL and APTT in 8 hours per protocol  Lu Duffel, PharmD, BCPS Clinical Pharmacist 07/28/2019 7:04 PM

## 2019-07-17 NOTE — ED Notes (Signed)
Dr. Damita Dunnings notified of patients low BP, new orders received for NS bolus and continue maintenance fluid.

## 2019-07-17 NOTE — ED Triage Notes (Signed)
Pt via EMS from home for increased weakness and AMS for the past 2 days and failure to thrive in the past month or so. Pt is extremely hard of hearing.

## 2019-07-17 NOTE — ED Notes (Signed)
Heparin verified with Casey Burkitt.

## 2019-07-18 ENCOUNTER — Inpatient Hospital Stay: Payer: Medicare HMO

## 2019-07-18 DIAGNOSIS — J9601 Acute respiratory failure with hypoxia: Secondary | ICD-10-CM | POA: Diagnosis present

## 2019-07-18 DIAGNOSIS — I4892 Unspecified atrial flutter: Secondary | ICD-10-CM | POA: Diagnosis present

## 2019-07-18 LAB — URINALYSIS, COMPLETE (UACMP) WITH MICROSCOPIC
Bacteria, UA: NONE SEEN
Bilirubin Urine: NEGATIVE
Glucose, UA: NEGATIVE mg/dL
Ketones, ur: NEGATIVE mg/dL
Leukocytes,Ua: NEGATIVE
Nitrite: NEGATIVE
Protein, ur: 100 mg/dL — AB
Specific Gravity, Urine: 1.014 (ref 1.005–1.030)
pH: 5 (ref 5.0–8.0)

## 2019-07-18 LAB — CBC
HCT: 32.5 % — ABNORMAL LOW (ref 39.0–52.0)
Hemoglobin: 10.8 g/dL — ABNORMAL LOW (ref 13.0–17.0)
MCH: 32 pg (ref 26.0–34.0)
MCHC: 33.2 g/dL (ref 30.0–36.0)
MCV: 96.4 fL (ref 80.0–100.0)
Platelets: 169 10*3/uL (ref 150–400)
RBC: 3.37 MIL/uL — ABNORMAL LOW (ref 4.22–5.81)
RDW: 13.8 % (ref 11.5–15.5)
WBC: 6.6 10*3/uL (ref 4.0–10.5)
nRBC: 0 % (ref 0.0–0.2)

## 2019-07-18 LAB — BASIC METABOLIC PANEL
Anion gap: 13 (ref 5–15)
BUN: 23 mg/dL (ref 8–23)
CO2: 20 mmol/L — ABNORMAL LOW (ref 22–32)
Calcium: 8.8 mg/dL — ABNORMAL LOW (ref 8.9–10.3)
Chloride: 105 mmol/L (ref 98–111)
Creatinine, Ser: 1.35 mg/dL — ABNORMAL HIGH (ref 0.61–1.24)
GFR calc Af Amer: 52 mL/min — ABNORMAL LOW (ref >60)
GFR calc non Af Amer: 45 mL/min — ABNORMAL LOW (ref >60)
Glucose, Bld: 110 mg/dL — ABNORMAL HIGH (ref 70–99)
Potassium: 3.6 mmol/L (ref 3.5–5.1)
Sodium: 138 mmol/L (ref 135–145)

## 2019-07-18 LAB — LIPID PANEL
Cholesterol: 210 mg/dL — ABNORMAL HIGH (ref 0–200)
HDL: 28 mg/dL — ABNORMAL LOW (ref >40)
LDL Cholesterol: 152 mg/dL — ABNORMAL HIGH (ref 0–99)
Total CHOL/HDL Ratio: 7.5 RATIO
Triglycerides: 149 mg/dL (ref <150)
VLDL: 30 mg/dL (ref 0–40)

## 2019-07-18 LAB — HEPARIN LEVEL (UNFRACTIONATED)
Heparin Unfractionated: 0.41 IU/mL (ref 0.30–0.70)
Heparin Unfractionated: 0.44 IU/mL (ref 0.30–0.70)

## 2019-07-18 LAB — SARS CORONAVIRUS 2 (TAT 6-24 HRS): SARS Coronavirus 2: POSITIVE — AB

## 2019-07-18 LAB — GLUCOSE, CAPILLARY: Glucose-Capillary: 182 mg/dL — ABNORMAL HIGH (ref 70–99)

## 2019-07-18 MED ORDER — VECURONIUM BROMIDE 10 MG IV SOLR
10.0000 mg | Freq: Once | INTRAVENOUS | Status: AC
  Administered 2019-07-18: 10 mg via INTRAVENOUS

## 2019-07-18 MED ORDER — ACETAMINOPHEN 650 MG RE SUPP
650.0000 mg | Freq: Three times a day (TID) | RECTAL | Status: DC | PRN
  Administered 2019-07-18: 650 mg via RECTAL
  Filled 2019-07-18: qty 1

## 2019-07-18 MED ORDER — PHENYLEPHRINE HCL-NACL 10-0.9 MG/250ML-% IV SOLN
25.0000 ug/min | INTRAVENOUS | Status: DC
  Filled 2019-07-18: qty 250

## 2019-07-18 MED ORDER — PROPOFOL 1000 MG/100ML IV EMUL
INTRAVENOUS | Status: AC
  Administered 2019-07-18: 5 ug/kg/min via INTRAVENOUS
  Filled 2019-07-18: qty 100

## 2019-07-18 MED ORDER — METOPROLOL TARTRATE 5 MG/5ML IV SOLN
5.0000 mg | INTRAVENOUS | Status: DC | PRN
  Administered 2019-07-18 – 2019-07-21 (×2): 5 mg via INTRAVENOUS
  Filled 2019-07-18 (×2): qty 5

## 2019-07-18 MED ORDER — FENTANYL CITRATE (PF) 100 MCG/2ML IJ SOLN
25.0000 ug | INTRAMUSCULAR | Status: DC | PRN
  Administered 2019-07-22: 100 ug via INTRAVENOUS
  Filled 2019-07-18 (×3): qty 2

## 2019-07-18 MED ORDER — SODIUM CHLORIDE 0.9 % IV SOLN
200.0000 mg | Freq: Once | INTRAVENOUS | Status: AC
  Administered 2019-07-18: 200 mg via INTRAVENOUS
  Filled 2019-07-18: qty 200

## 2019-07-18 MED ORDER — IPRATROPIUM BROMIDE HFA 17 MCG/ACT IN AERS
2.0000 | INHALATION_SPRAY | Freq: Four times a day (QID) | RESPIRATORY_TRACT | Status: DC
  Filled 2019-07-18: qty 12.9

## 2019-07-18 MED ORDER — PROPOFOL 1000 MG/100ML IV EMUL
5.0000 ug/kg/min | INTRAVENOUS | Status: DC
  Administered 2019-07-19: 15 ug/kg/min via INTRAVENOUS
  Filled 2019-07-18: qty 100

## 2019-07-18 MED ORDER — SODIUM CHLORIDE 0.9 % IV SOLN
100.0000 mg | Freq: Every day | INTRAVENOUS | Status: AC
  Administered 2019-07-19 – 2019-07-22 (×4): 100 mg via INTRAVENOUS
  Filled 2019-07-18: qty 20
  Filled 2019-07-18 (×4): qty 100

## 2019-07-18 MED ORDER — ORAL CARE MOUTH RINSE
15.0000 mL | OROMUCOSAL | Status: DC
  Administered 2019-07-19 – 2019-07-22 (×34): 15 mL via OROMUCOSAL
  Filled 2019-07-18 (×5): qty 15

## 2019-07-18 MED ORDER — LORAZEPAM 2 MG/ML IJ SOLN: 2.0000 mg | Freq: Once | INTRAMUSCULAR | Status: DC

## 2019-07-18 MED ORDER — FOLIC ACID 5 MG/ML IJ SOLN
1.0000 mg | Freq: Every day | INTRAMUSCULAR | Status: DC
  Administered 2019-07-19 (×2): 1 mg via INTRAVENOUS
  Filled 2019-07-18 (×2): qty 0.2

## 2019-07-18 MED ORDER — LORAZEPAM 2 MG/ML IJ SOLN
INTRAMUSCULAR | Status: AC
  Administered 2019-07-18: 2 mg
  Filled 2019-07-18: qty 1

## 2019-07-18 MED ORDER — FENTANYL CITRATE (PF) 100 MCG/2ML IJ SOLN: 25.0000 ug | INTRAMUSCULAR | Status: DC | PRN

## 2019-07-18 MED ORDER — CHLORHEXIDINE GLUCONATE 0.12% ORAL RINSE (MEDLINE KIT)
15.0000 mL | Freq: Two times a day (BID) | OROMUCOSAL | Status: DC
  Administered 2019-07-19 – 2019-07-22 (×6): 15 mL via OROMUCOSAL
  Filled 2019-07-18 (×2): qty 15

## 2019-07-18 MED ORDER — ETOMIDATE 2 MG/ML IV SOLN
INTRAVENOUS | Status: AC | PRN
  Administered 2019-07-18: 20 mg via INTRAVENOUS

## 2019-07-18 MED ORDER — SODIUM CHLORIDE 0.9 % IV SOLN
250.0000 mL | INTRAVENOUS | Status: DC
  Administered 2019-07-19: 250 mL via INTRAVENOUS

## 2019-07-18 MED ORDER — ACETAMINOPHEN 650 MG RE SUPP: 650.0000 mg | Freq: Four times a day (QID) | RECTAL | Status: DC | PRN

## 2019-07-18 MED ORDER — CHLORHEXIDINE GLUCONATE CLOTH 2 % EX PADS
6.0000 | MEDICATED_PAD | Freq: Every day | CUTANEOUS | Status: DC
  Administered 2019-07-19 – 2019-07-22 (×4): 6 via TOPICAL

## 2019-07-18 MED ORDER — SODIUM CHLORIDE 0.9 % IV SOLN
25.0000 ug/min | INTRAVENOUS | Status: DC
  Administered 2019-07-19: 25 ug/min via INTRAVENOUS
  Filled 2019-07-18: qty 1
  Filled 2019-07-18: qty 10

## 2019-07-18 MED ORDER — DEXAMETHASONE SODIUM PHOSPHATE 10 MG/ML IJ SOLN
6.0000 mg | INTRAMUSCULAR | Status: DC
  Administered 2019-07-19 – 2019-07-21 (×4): 6 mg via INTRAVENOUS
  Filled 2019-07-18 (×5): qty 0.6

## 2019-07-18 MED ORDER — FAMOTIDINE IN NACL 20-0.9 MG/50ML-% IV SOLN
20.0000 mg | Freq: Two times a day (BID) | INTRAVENOUS | Status: DC
  Administered 2019-07-19 (×2): 20 mg via INTRAVENOUS
  Filled 2019-07-18 (×2): qty 50

## 2019-07-18 MED ORDER — METOPROLOL TARTRATE 5 MG/5ML IV SOLN: 5.0000 mg | INTRAVENOUS | Status: DC | PRN

## 2019-07-18 MED ORDER — THIAMINE HCL 100 MG/ML IJ SOLN
100.0000 mg | Freq: Every day | INTRAMUSCULAR | Status: DC
  Administered 2019-07-19 (×2): 100 mg via INTRAVENOUS
  Filled 2019-07-18 (×2): qty 2

## 2019-07-18 MED ORDER — SODIUM CHLORIDE 0.9 % IV SOLN
3.0000 g | Freq: Four times a day (QID) | INTRAVENOUS | Status: DC
  Administered 2019-07-18 – 2019-07-19 (×2): 3 g via INTRAVENOUS
  Filled 2019-07-18: qty 3
  Filled 2019-07-18 (×2): qty 8

## 2019-07-18 MED ORDER — METOPROLOL TARTRATE 25 MG PO TABS
25.0000 mg | ORAL_TABLET | Freq: Four times a day (QID) | ORAL | Status: DC
  Administered 2019-07-18: 25 mg via ORAL
  Filled 2019-07-18: qty 1

## 2019-07-18 MED ORDER — SUCCINYLCHOLINE CHLORIDE 20 MG/ML IJ SOLN
INTRAMUSCULAR | Status: AC | PRN
  Administered 2019-07-18: 80 mg via INTRAVENOUS

## 2019-07-18 NOTE — ED Notes (Signed)
Lunch tray given. 

## 2019-07-18 NOTE — ED Notes (Signed)
Patient's heart rate alarming at 130 on monitor. RN to bedside. Patient attempting to climb out of bed. Second RN called for assistance. Patient slid back up in bed and repositioned. This RN verified bed alarm on, and bed in lowest position. This RN called telesitter to verified system is working. Sitter verified that they had attempted to redirect patient to stay in bed prior to RN entering room. Patient laying comfortable in bed when RN left. RN and telesitter will continue to monitor.

## 2019-07-18 NOTE — ED Notes (Signed)
Report given to Lexie, RN 

## 2019-07-18 NOTE — ED Provider Notes (Signed)
Called to room emergently to evaluate patient in respiratory distress.  Nurse report relatively acute worsening, patient is known Covid positive.  Now with severe increased work of breathing, breathing over 40 times per minute and oxygen saturations in the high 80s with nasal cannula and nonrebreather.  Also with apparent atrial fibrillation.  Stat chest x-ray demonstrates acute worsening with bilateral multifocal infiltrates.  Decision made to intubate given that the patient is full code  Procedure Name: Intubation Date/Time: 07/18/2019 9:24 PM Performed by: Lavonia Drafts, MD Pre-anesthesia Checklist: Patient identified, Patient being monitored, Emergency Drugs available, Timeout performed and Suction available Oxygen Delivery Method: Non-rebreather mask Preoxygenation: Pre-oxygenation with 100% oxygen Induction Type: Rapid sequence Ventilation: Mask ventilation without difficulty Laryngoscope Size: 4 and Glidescope Tube size: 8.0 mm Number of attempts: 1 Placement Confirmation: ETT inserted through vocal cords under direct vision,  CO2 detector and Breath sounds checked- equal and bilateral Secured at: 23 cm Tube secured with: ETT holder      Will notify attending physician   Lavonia Drafts, MD 07/18/19 2125

## 2019-07-18 NOTE — ED Notes (Signed)
Pt aspirated on his emesis and started coughing up bright red sputum. MD Gwynneth Albright was notified- waiting on response. RT at bedside, suggests flash pulmonary edema. Rhonchi noted in lungs. Pt 85% when switched to simple mask and 87% on NR mask at 15 liters. ED MD notified and at bedside.

## 2019-07-18 NOTE — ED Notes (Signed)
Tele-sitter set up and plugged into room- called tele-monitor and provided extension. State they will call back to confirm camera.

## 2019-07-18 NOTE — ED Notes (Signed)
Pt switched to a hospital bed for comfort. Pt assisted with urinal. Brief changed.

## 2019-07-18 NOTE — ED Notes (Addendum)
Pt assisted to use urinal and repositioned in bed- given meal tray

## 2019-07-18 NOTE — ED Notes (Signed)
Pt repositioned in bed.  Meal given

## 2019-07-18 NOTE — ED Notes (Signed)
Pt attempted to get out of bed. Pt redirected to bed and assisted with urinal. IV's reinforced with coban for safety. Will request sitter due to pt's fall risk.

## 2019-07-18 NOTE — Code Documentation (Signed)
Pt intubated 8.0 tube, 23 at the lip, + color change by Dr. Corky Downs

## 2019-07-18 NOTE — ED Notes (Signed)
bp dropping. 87/48, propofol reduced to 20 mcg

## 2019-07-18 NOTE — ED Notes (Signed)
Pt transferred to room 6 , EDP aware. Pt coughing non rebreather mask on 15L

## 2019-07-18 NOTE — Progress Notes (Signed)
PROGRESS NOTE    Richard Elliott  [id number] DOB: 1925/03/25 DOA: 07/29/2019 PCP: Ezequiel Kayser, MD    Brief Narrative:  Richard Elliott is a 86 y.o. hearing impaired male with medical history significant for CAD status post CABG, history of atrial valve replacement, atrial flutter on Eliquis, and history of prostate cancer lives independently who presented to the emergency room by EMS after daughter noticed that he has been declining for the past month with increasing weakness over the past couple days associated with confusion.   In the ED he was found with rapid atrial flutter with heart rate of 135.  EKG showed A. fib RVR with new bundle branch block.  Troponin was elevated so was BNP.Chest x-ray showed no acute disease, head CT no acute intracranial findings.    Cardiology was consulted.  Patient is Covid positive    Consultants:   Cards  Procedures: none  Antimicrobials:   none    Subjective: Pt laying in bed, bit confused, does not report cp or sob  Objective: Vitals:   07/18/19 1500 07/18/19 1514 07/18/19 1530 07/18/19 1600  BP: 103/63  (!) 101/56 (!) 113/58  Pulse: (!) 111  (!) 109 (!) 111  Resp:  18    Temp:      TempSrc:      SpO2: 99%  99% 98%  Weight:      Height:        Intake/Output Summary (Last 24 hours) at 07/18/2019 1608 Last data filed at 07/18/2019 1117 Gross per 24 hour  Intake 1943.01 ml  Output -  Net 1943.01 ml   Filed Weights   07/26/2019 1736  Weight: 65.8 kg    Examination:  General exam: Appears calm and comfortable , nad, hard of hearing Respiratory system: Clear to auscultation with poor respiratory effort Cardiovascular system: Irregular S1 & S2 heard, no murmurs, rubs, gallops or clicks.  Gastrointestinal system: Abdomen is nondistended, soft and nontender.. Normal bowel sounds heard. Central nervous system: Awake,knows his name, otherwise confused a bit .  Extremities: No edema Skin: Warm dry Psychiatry: JMood & affect  appropriate appropriate for current setting.     Data Reviewed: I have personally reviewed following labs and imaging studies  CBC: Recent Labs  Lab 07/10/2019 1755 07/18/19 0632  WBC 6.6 6.6  NEUTROABS 5.0  --   HGB 11.9* 10.8*  HCT 36.3* 32.5*  MCV 96.5 96.4  PLT 202 [id number]   Basic Metabolic Panel: Recent Labs  Lab 07/28/2019 1755 07/18/19 0632  NA 139 138  K 3.8 3.6  CL 104 105  CO2 21* 20*  GLUCOSE 130* 110*  BUN 26* 23  CREATININE 1.32* 1.35*  CALCIUM 9.5 8.8*   GFR: Estimated Creatinine Clearance: 32.5 mL/min (A) (by C-G formula based on SCr of 1.35 mg/dL (H)). Liver Function Tests: Recent Labs  Lab 07/14/2019 1755  AST 47*  ALT 21  ALKPHOS 43  BILITOT 1.3*  PROT 7.9  ALBUMIN 4.1   No results for input(s): LIPASE, AMYLASE in the last 168 hours. No results for input(s): AMMONIA in the last 168 hours. Coagulation Profile: Recent Labs  Lab 08/01/2019 2007  INR 1.0   Cardiac Enzymes: No results for input(s): CKTOTAL, CKMB, CKMBINDEX, TROPONINI in the last 168 hours. BNP (last 3 results) No results for input(s): PROBNP in the last 8760 hours. HbA1C: No results for input(s): HGBA1C in the last 72 hours. CBG: No results for input(s): GLUCAP in the last 168 hours. Lipid Profile: Recent Labs  07/18/19 0632  CHOL 210*  HDL 28*  LDLCALC 152*  TRIG 149  CHOLHDL 7.5   Thyroid Function Tests: No results for input(s): TSH, T4TOTAL, FREET4, T3FREE, THYROIDAB in the last 72 hours. Anemia Panel: No results for input(s): VITAMINB12, FOLATE, FERRITIN, TIBC, IRON, RETICCTPCT in the last 72 hours. Sepsis Labs: No results for input(s): PROCALCITON, LATICACIDVEN in the last 168 hours.  Recent Results (from the past 240 hour(s))  Culture, blood (routine x 2)     Status: None (Preliminary result)   Collection Time: 08/03/2019  5:55 PM   Specimen: BLOOD  Result Value Ref Range Status   Specimen Description BLOOD BLOOD RIGHT FOREARM  Final   Special Requests   Final     BOTTLES DRAWN AEROBIC AND ANAEROBIC Blood Culture adequate volume   Culture   Final    NO GROWTH < 24 HOURS Performed at [facility], [address], [place], [place] [address]    Report Status PENDING  Incomplete  Culture, blood (routine x 2)     Status: None (Preliminary result)   Collection Time: 08/04/2019  8:07 PM   Specimen: BLOOD  Result Value Ref Range Status   Specimen Description BLOOD BLOOD LEFT HAND  Final   Special Requests   Final    BOTTLES DRAWN AEROBIC AND ANAEROBIC Blood Culture adequate volume   Culture   Final    NO GROWTH < 12 HOURS Performed at [facility], [address] [place], [place] [address]    Report Status PENDING  Incomplete  SARS CORONAVIRUS 2 (TAT 6-24 HRS) Nasopharyngeal Nasopharyngeal Swab     Status: Abnormal   Collection Time: 07/28/2019  8:07 PM   Specimen: Nasopharyngeal Swab  Result Value Ref Range Status   SARS Coronavirus 2 POSITIVE (A) NEGATIVE Final    Comment: RESULT CALLED TO, READ BACK BY AND VERIFIED WITH: DAVIS B, RN 0719 ON 07/18/2019 BY SAINVILUS S (NOTE) SARS-CoV-2 target nucleic acids are DETECTED. The SARS-CoV-2 RNA is generally detectable in upper and lower respiratory specimens during the acute phase of infection. Positive results are indicative of the presence of SARS-CoV-2 RNA. Clinical correlation with patient history and other diagnostic information is  necessary to determine patient infection status. Positive results do not rule out bacterial infection or co-infection with other viruses.  The expected result is Negative. Fact Sheet for Patients: SugarRoll.be Fact Sheet for Healthcare Providers: https://www.woods-mathews.com/ This test is not yet approved or cleared by the [place] FDA and  has been authorized for detection and/or diagnosis of SARS-CoV-2 by FDA under an Emergency Use Authorization (EUA). This EUA will remain  in effect (meaning this  test can be use d) for the duration of the COVID-19 declaration under Section 564(b)(1) of the Act, 21 U.S.C. section 360bbb-3(b)(1), unless the authorization is terminated or revoked sooner. Performed at [facility], [place] [address], [place], [place] [address]          Radiology Studies: CT HEAD WO CONTRAST  Result Date: 07/24/2019 CLINICAL DATA:  Weakness and altered mental status EXAM: CT HEAD WITHOUT CONTRAST TECHNIQUE: Contiguous axial images were obtained from the base of the skull through the vertex without intravenous contrast. COMPARISON:  CT head dated 03/27/2016. FINDINGS: Brain: No evidence of acute infarction, hemorrhage, hydrocephalus, extra-axial collection or mass lesion/mass effect. Periventricular white matter hypoattenuation likely represents chronic small vessel ischemic disease. Vascular: There are vascular calcifications in the carotid siphons. Skull: Normal. Negative for fracture or focal lesion. Sinuses/Orbits: The patient  is status post a left mastoidectomy. Other: None. IMPRESSION: 1. No acute intracranial process. Electronically Signed   By: Zerita Boers M.D.   On: 07/19/2019 18:48   DG Chest Port 1 View  Result Date: 07/26/2019 CLINICAL DATA:  Altered mental status EXAM: PORTABLE CHEST 1 VIEW COMPARISON:  Chest radiograph dated 03/29/2016 FINDINGS: The heart is normal in size. Median sternotomy wires are redemonstrated. The lungs are hypoinflated. There is mild bibasilar atelectasis/airspace disease. There is no pleural effusion or pneumothorax. Degenerative changes are seen in both shoulders. IMPRESSION: 1. Low lung volumes with mild bibasilar atelectasis/airspace disease. Electronically Signed   By: Zerita Boers M.D.   On: 07/27/2019 19:16        Scheduled Meds: . aspirin EC  81 mg Oral Daily  . finasteride  5 mg Oral Daily  . furosemide  40 mg Oral Daily  . magnesium chloride  1 tablet Oral BID  . metoprolol tartrate  25 mg Oral Q6H  . mirabegron  ER  25 mg Oral Daily  . pantoprazole  40 mg Oral Daily  . tamsulosin  0.4 mg Oral Daily  . [START ON 07/19/2019] vitamin B-12  1,000 mcg Oral Once per day on Mon Thu   Continuous Infusions: . heparin 800 Units/hr (07/09/2019 2020)    Assessment & Plan:   Principal Problem:   NSTEMI (non-ST elevated myocardial infarction) ([place]) Active Problems:   Atrial flutter with rapid ventricular response (HCC)   CAD (coronary artery disease)   S/P TAVR (transcatheter aortic valve replacement)   Acute metabolic encephalopathy   Generalized weakness   Atrial flutter (HCC)   NSTEMI (non-ST elevated myocardial infarction) ([place]) Coronary artery disease with history of CABG --Cardiologist input was appreciated, elevated troponin likely demand ischemia. Patient's bundle branch block on EKG is not consistent with ACS. Per cards is not a candidate for invasive cardiac cath at present given multiple comorbidities and advanced age Will continue medical management Recommend heparin for 24 hours and resume Eliquis in a.m. Continue aspirin beta-blockers and statins --Elevated troponin may also be secondary to demand ischemia from a flutter with RVR --Continue to cycle troponins --Continue heparin drip started in the emergency room(hold home eliquis) --Continue aspirin, beta-blockers   Acute metabolic encephalopathy --At baseline patient functions independently Has hx/o cva, likely some underlying dementia (which I discussed with his daughter) --Now confused and this is possibly medical issues including Covid Follow-up urine culture and blood cultures --Neurochecks, fall and aspiration precautions     Atrial flutter /afib with rapid ventricular response (HCC) Rate not well controlled , I increased  metoprolol to every 6 with parameters earlier DC heparin in a.m. and start Eliquis    S/P TAVR (transcatheter aortic valve replacement) --No acute issues, being followed at [facility]    Generalized  weakness --Reported 1 month history of overall decline worse over the past 2 days --Chart review does reveal history of falls, had hospitalization for syncope in 2017 --Debility related to acute illness as outlined above, likely superimposed on chronic physical deconditioning --physical therapy evaluation --Fall precautions      DVT prophylaxis: Heparin drip Code Status: Full Family Communication: Spoke to daughter about assessment and plan.  I also told her the patient is Covid positive and her and her brother and other family members that were exposed to him will need testing and quarantining for 14 days.  Verbalizes an understanding. Disposition Plan: DC in couple of days once medically stable from cardiac standpoint and his mental status resumes  to baseline. PT/OT consult.        LOS: 1 day   Time spent: 45 minutes with more than 50% on [place], MD [facility] Pager [phone]  If 7PM-7AM, please contact night-coverage [email or web address] Password [facility] 07/18/2019, 4:08 PM

## 2019-07-18 NOTE — Consult Note (Addendum)
Name: Richard Elliott MRN: [id number] DOB: Apr 06, 1925    ADMISSION DATE:  07/12/2019 CONSULTATION DATE: 07/18/2019  REFERRING MD : Dr. Posey Pronto   CHIEF COMPLAINT: Altered Mental Status   BRIEF PATIENT DESCRIPTION:  86 yo male admitted with acute encephalopathy, atrial fibrillation with rvr, NSTEMI vs. demand ischemia, and acute hypoxic respiratory failure secondary to aspiration pneumonia and COVID-19 requiring mechanical intubation   SIGNIFICANT EVENTS/STUDIES:  01/9: Pt presented to [facility] ER with worsening weakness and altered mental status dx with COVID-19, atrial fibrillation with rvr, acute metabolic encephalopathy, and NSTEMI vs. demand ischemia requiring 2L O2 via nasal canula. Pt admitted to telemetry unit but remained in [facility] ER pending bed availability  01/9: CT Head revealed no acute intracranial process 01/10: Cardiology consulted recommended continuing heparin gtt although felt symptoms not consistent with acute coronary syndrome, pt deemed not a candidate for invasive cardiac catheterization  01/10: Pt developed worsening respiratory failure suspected secondary to aspiration following coughing episode with bloody sputum production followed by an episode of emesis requiring mechanical intubation.  PCCM contacted by hospitalist team to assume care    HISTORY OF PRESENT ILLNESS:   This is a 86 yo male with a PMH of Prostate Cancer, MI, CABG, Mitral Stenosis, HOH (requiring bilateral hearing aids), HLD, GERD, Headache, Compressed Spine Fracture, CAD, UTI/Prostatitis, HTN, Left Frontal Infarct (06/2019), Chronic CHF with Preserved EF, Chronic Atrial Flutter/Fibrillation (on apixaban), Severe Aortic Stenosis, s/p TAVR (06/2016), and Allergic Rhinitis.  He presented to [facility] ER from home via EMS on 01/9 with worsening weaknes, confusion, poor po intake, and unable to perform ADL's onset of symptoms several days prior to presentation.  Per ER notes at baseline the pt is alert and oriented,  lives independently, and independently performs his ADL's.  In the ER lab results revealed CO2 21, glucose 130, BUN 26, creatinine 1.32, AST 47, BNP 252, troponin 611, hgb 11.9, and UA negative for UTI.  EKG revealed atrial fibrillation with rvr, hr 130's with new left BBB, cardiology consulted recommended treating as NSTEMI with initiation of heparin gtt. CXR concerning for bibasilar atelectasis, COVID-19 positive.  CT Head negative.  Pt subsequently admitted to the telemetry unit per hospitalist team, however remained in [facility] ER pending bed availability. While in the ER on 01/10 pt developed hemoptysis followed by an episode of emesis resulting in worsening acute hypoxic respiratory failure suspected secondary to aspiration requiring mechanical intubation. PCCM contacted to to assume care.  Detailed hospital course listed above under significant events.   PAST MEDICAL HISTORY :   has a past medical history of Allergic rhinitis (08/04/2014), Aortic heart valve narrowing (11/05/2011), Arthritis or polyarthritis, rheumatoid ([place]) (11/05/2011), Atrial flutter ([place]) (04/16/2013), Bacterial urinary infection (11/05/2011), Benign fibroma of prostate (11/05/2011), BP (high blood pressure) (11/05/2011), CAD in native artery (08/04/2014), Chronic otitis externa (11/05/2011), Compressed spine fracture ([place]) (11/05/2011), Deafness, sensorineural (12/30/2012), GERD (gastroesophageal reflux disease), Headache, Hearing aid worn, HLD (hyperlipidemia) (11/05/2011), HOH (hard of hearing), Mitral stenosis (12/28/2013), Myocardial infarction [facility] [place]) (1995 & 2017), and Prostate cancer ([place]).  has a past surgical history that includes Prostate surgery; Cardiac surgery (1991); Cardiac catheterization; Aortic valve replacement (06/12/2016); Coronary artery bypass graft (1999 & 2000); A flutter ablation (2012); Balloon dilation (Left, 12/09/2016); and Nasal endoscopy (Bilateral, 12/09/2016). Prior to Admission medications   Medication Sig Start Date  End Date Taking? Authorizing Provider  acetaminophen (TYLENOL) 650 MG CR tablet Take 650 mg by mouth 2 (two) times daily as needed for pain.   Yes [provider]  aspirin 81 MG tablet Take 81 mg by mouth daily.  08/22/08  Yes [provider]  Biotin 1000 MCG tablet Take 1,000 mcg by mouth 2 (two) times a week.   Yes [provider]  calcium carbonate (TUMS - DOSED IN MG ELEMENTAL CALCIUM) 500 MG chewable tablet Chew 1 tablet by mouth daily as needed for indigestion or heartburn.   Yes [provider]  cetirizine (ZYRTEC) 10 MG tablet Take 10 mg by mouth daily.    Yes [provider]  ELIQUIS 2.5 MG TABS tablet Take 2.5 mg by mouth 2 (two) times daily.  04/02/15  Yes [provider]  finasteride (PROSCAR) 5 MG tablet TAKE 1 TABLET BY MOUTH ONCE DAILY 01/18/18  Yes McGowan, Larene Beach A, PA-C  fluticasone (FLONASE) 50 MCG/ACT nasal spray Place 1 spray into the nose at bedtime.  11/16/10  Yes [provider]  furosemide (LASIX) 40 MG tablet Take 40 mg by mouth daily. 07/12/19  Yes [provider]  Glucosamine HCl (GLUCOSAMINE PO) Take 1 tablet by mouth daily.   Yes [provider]  losartan (COZAAR) 50 MG tablet Take 50 mg by mouth daily.  04/02/15  Yes [provider]  magnesium chloride (SLOW-MAG) 64 MG TBEC SR tablet Take 1 tablet by mouth 2 (two) times daily.    Yes [provider]  metoprolol tartrate (LOPRESSOR) 25 MG tablet Take 25 mg by mouth 2 (two) times daily.  01/04/15  Yes [provider]  mirabegron ER (MYRBETRIQ) 25 MG TB24 tablet Take 1 tablet (25 mg total) by mouth daily. 08/21/17  Yes McGowan, Larene Beach A, PA-C  montelukast (SINGULAIR) 10 MG tablet Take 10 mg by mouth daily.  04/02/15  Yes [provider]  nitroGLYCERIN (NITROSTAT) 0.4 MG SL tablet Place 1 tablet under tongue as needed for chest pain (may repeat every 5 minutes but seek medical help if pain persists after 3 tablets)  as needed 08/22/08  Yes [provider]  omeprazole (PRILOSEC) 20 MG capsule Take 20 mg by mouth daily as needed (heartburn).   Yes [provider]  polyethylene glycol (MIRALAX / GLYCOLAX) packet Take 17 g by mouth daily as needed for mild constipation.    Yes [provider]  Potassium 99 MG TABS Take 99 mg by mouth daily as needed (nutritional supplementation).   Yes [provider]  senna-docusate (SENOKOT-S) 8.6-50 MG tablet Take 1 tablet by mouth at bedtime as needed for mild constipation.    Yes [provider]  tamsulosin (FLOMAX) 0.4 MG CAPS capsule TAKE 1 CAPSULE BY MOUTH ONCE DAILY 01/18/18  Yes McGowan, Larene Beach A, PA-C  vitamin B-12 (CYANOCOBALAMIN) 1000 MCG tablet Take 1,000 mcg by mouth 2 (two) times a week.    Yes [provider]  Vitamin D, Ergocalciferol, (DRISDOL) 1.25 MG (50000 UT) CAPS capsule Take 50,000 Units by mouth once a week. 07/08/19  Yes [provider]   Allergies  Allergen Reactions  . Eggs Or Egg-Derived Products Itching    Digestive issues and itching ONLY, per daughter  . Lisinopril     Cough  . Atorvastatin     Pain to walk, stiffness in joints  . Fenofibrate     Leg Pain  . Niacin     Hot flashes  . Peanut-Containing Drug Products Itching    Eating a large of peanuts continuously. Same reaction with other tree nuts.  . Simvastatin     Pain to walk and joint stiffness in  legs  . Soy Allergy Other (See Comments)    Digestive issues  . Wheat Bran     Digestive issues  . Aspirin Nausea Only     GI Upset, in high doses in uncoated aspirin   . Nitrofuran Derivatives     GI UPSET    FAMILY HISTORY:  family history includes Breast cancer in his sister; Heart attack in his maternal grandfather; Heart failure in his mother; Hypertension in his paternal grandmother; Leukemia in his father. SOCIAL HISTORY:  reports that he has never smoked. He has never used smokeless tobacco. He reports  current alcohol use. He reports that he does not use drugs.  REVIEW OF SYSTEMS:   Unable to assess pt intubated   SUBJECTIVE:  Unable to assess pt intubated   VITAL SIGNS: Temp:  [99.5 F (37.5 C)-103.5 F (39.7 C)] 103.4 F (39.7 C) (01/10 2152) Pulse Rate:  [36-150] 110 (01/10 2152) Resp:  [14-47] 22 (01/10 2152) BP: (79-163)/(38-127) 104/54 (01/10 2154) SpO2:  [80 %-100 %] 97 % (01/10 2152) FiO2 (%):  [100 %] 100 % (01/10 2130) Weight:  [70 kg] 70 kg (01/10 2122)  PHYSICAL EXAMINATION: General: acutely ill appearing male, NAD mechanically intubated  Neuro: sedated, not following commands, PERRL  HEENT: supple, no JVD  Cardiovascular: irregular irregular, no R/G  Lungs: faint rhonchi throughout, even, non labored  Abdomen: +BS x4, soft, non distended  Musculoskeletal: normal bulk and tone, no edema  Skin: intact no rashes or lesions present   Recent Labs  Lab 07/09/2019 1755 07/18/19 0632  NA 139 138  K 3.8 3.6  CL 104 105  CO2 21* 20*  BUN 26* 23  CREATININE 1.32* 1.35*  GLUCOSE 130* 110*   Recent Labs  Lab 07/15/2019 1755 07/18/19 0632  HGB 11.9* 10.8*  HCT 36.3* 32.5*  WBC 6.6 6.6  PLT 202 169   DG Chest 1 View  Result Date: 07/18/2019 CLINICAL DATA:  Shortness of breath EXAM: CHEST  1 VIEW COMPARISON:  Ascites FINDINGS: There has been interval development of multifocal airspace opacities. There is no pneumothorax. No large pleural effusion. The patient is status post prior median sternotomy. The patient is status post prior TAVR. Aortic calcifications are noted. There are advanced degenerative changes of both glenohumeral joints. IMPRESSION: Short interval development of multifocal airspace opacities. Differential considerations include multifocal pneumonia (viral or bacterial) or developing pulmonary edema. Electronically Signed   By: Constance Holster M.D.   On: 07/18/2019 20:57   CT HEAD WO CONTRAST  Result Date: 08/04/2019 CLINICAL DATA:  Weakness and  altered mental status EXAM: CT HEAD WITHOUT CONTRAST TECHNIQUE: Contiguous axial images were obtained from the base of the skull through the vertex without intravenous contrast. COMPARISON:  CT head dated 03/27/2016. FINDINGS: Brain: No evidence of acute infarction, hemorrhage, hydrocephalus, extra-axial collection or mass lesion/mass effect. Periventricular white matter hypoattenuation likely represents chronic small vessel ischemic disease. Vascular: There are vascular calcifications in the carotid siphons. Skull: Normal. Negative for fracture or focal lesion. Sinuses/Orbits: The patient is status post a left mastoidectomy. Other: None. IMPRESSION: 1. No acute intracranial process. Electronically Signed   By: Zerita Boers M.D.   On: 07/21/2019 18:48   DG Chest Port 1 View  Result Date: 07/14/2019 CLINICAL DATA:  Altered mental status EXAM: PORTABLE CHEST 1 VIEW COMPARISON:  Chest radiograph dated 03/29/2016 FINDINGS: The heart is normal in size. Median sternotomy wires are redemonstrated. The lungs are hypoinflated. There is mild bibasilar atelectasis/airspace disease. There is no  pleural effusion or pneumothorax. Degenerative changes are seen in both shoulders. IMPRESSION: 1. Low lung volumes with mild bibasilar atelectasis/airspace disease. Electronically Signed   By: Zerita Boers M.D.   On: 08/06/2019 19:16    ASSESSMENT / PLAN:  Acute hypoxic respiratory failure secondary to aspiration pneumonia and COVID-19 Mechanical intubation  Full vent support for now-vent settings reviewed and established Target PaO2 55-65: prone at least 16hrs to achieve target PaO2 and titrate PEEP/FiO2 per protocol SBT once all parameters met  VAP bundle implemented Will start decadron, bronchodilator therapy, vitamins, and remdesivir Trend inflammatory markers  Trend WBC and monitor fever curve  Trend PCT and lactic acid Follow cultures  Continue unasyn for aspiration coverage Maintain airborne and contact  precautions   Atrial fibrillation/flutter with rvr  Elevated troponin secondary to demand ischemia in setting of COVID-19 vs. NSTEMI  Hypotension secondary to sepsis and hypovolemia   Hx: Severe aortic stenosis, s/pTAVR (06/2016), CAD, HLD, MI, CABG, Chronic atrial flutter, and HTN  Continuous telemetry monitoring Hold outpatient antihypertensives and apixaban for now  Trend troponin's  Cardiology consulted appreciate input-deemed not a candidate for invasive cardiac catheterization due to multiple comorbidities/advanced age and symptoms not consistent with acute coronary syndrome   Acute renal failure secondary to sepsis and hypovolemia in setting of poor po intake  Trend BMP  Replace electrolytes as indicated  Monitor UOP  Avoid nephrotoxic medications Gentle fluid rehydration    Hemoptysis-resolving If pts coags and h&h stable will resume heparin gtt  Monitor for s/sx of bleeding  Hold outpatient apixaban for for now   Hyperglycemia secondary to steroids  CBG's q4hrs  SSI   Acute encephalopathy secondary to infectious process (CT Head 07/17/19 negative) Mechanical intubation pain/discomfort  Maintain RASS goal -1 to -2  Propofol gtt and prn fentanyl to maintain RASS goal  WUA daily   Best Practice: VTE px: SCD's for now, will resume heparin gtt if coags/h&h stable and no signs of active bleeding SUP px: iv pepcid   -Updated pts daughter Drucie Ip via telephone informed her pt is currently mechanically intubated due to decline in respiratory status and discussed current plan of care.  We discussed pts code status she stated she does not think her father would want CPR, defibrillation, or ACLS medications if he were to cardiac arrest.  However, she would like to discuss this with her brothers and will call back on 07/19/19 regarding their decision.  I also informed her the pt may transfer to [facility] pending bed availability.  All questions were answered.    Marda Stalker, [place] Pager [phone] (please enter 7 digits) PCCM Consult Pager [phone] (please enter 7 digits)

## 2019-07-18 NOTE — Consult Note (Signed)
[place] for heparin drip Indication: chest pain/ACS/NSTEMI  Allergies  Allergen Reactions  . Eggs Or Egg-Derived Products Itching    Digestive issues and itching ONLY, per daughter  . Lisinopril     Cough  . Atorvastatin     Pain to walk, stiffness in joints  . Fenofibrate     Leg Pain  . Niacin     Hot flashes  . Peanut-Containing Drug Products Itching    Eating a large of peanuts continuously. Same reaction with other tree nuts.  . Simvastatin     Pain to walk and joint stiffness in legs  . Soy Allergy Other (See Comments)    Digestive issues  . Wheat Bran     Digestive issues  . Aspirin Nausea Only     GI Upset, in high doses in uncoated aspirin   . Nitrofuran Derivatives     GI UPSET    Patient Measurements: Height: 5\' 8"  (172.7 cm) Weight: 145 lb (65.8 kg) IBW/kg (Calculated) : 68.4 Heparin Dosing Weight: 68.4kg  Vital Signs: BP: 127/51 (01/10 1200) Pulse Rate: 79 (01/10 1015)  Labs: Recent Labs    07/10/2019 1755 07/16/2019 2007 07/18/19 0319 07/18/19 0632 07/18/19 1219  HGB 11.9*  --   --  10.8*  --   HCT 36.3*  --   --  32.5*  --   PLT 202  --   --  169  --   APTT  --  48*  --   --   --   LABPROT  --  13.3  --   --   --   INR  --  1.0  --   --   --   HEPARINUNFRC  --  <0.10* 0.41  --  0.44  CREATININE 1.32*  --   --  1.35*  --   TROPONINIHS 611*  --   --   --   --     Estimated Creatinine Clearance: 32.5 mL/min (A) (by C-G formula based on SCr of 1.35 mg/dL (H)).   Medical History: Past Medical History:  Diagnosis Date  . Allergic rhinitis 08/04/2014  . Aortic heart valve narrowing 11/05/2011   Overview:  a.  03/2010:  moderate.  33 mmHg peak grad 24 mmHg mean grad 1.3 cm2    . Arthritis or polyarthritis, rheumatoid ([place]) 11/05/2011   wrist, hands, hips, knees  . Atrial flutter ([place]) 04/16/2013   Overview:  10/14 hosp, felt to be secondary to choking episode.  TEE and DC CV.   . Bacterial urinary  infection 11/05/2011   Overview:  Urinary tract infections/prostatitis.  Followed by a urologist in [place], [place].   Marland Kitchen Benign fibroma of prostate 11/05/2011   Overview:  a. Status post prostatic biopsy x 2 which have been negative.   . BP (high blood pressure) 11/05/2011  . CAD in native artery 08/04/2014   Overview:  a. Status post PCI of RCA x 2.  b. 4/00 Positive Myoview. Catheterization revealed EF 70% with 3-vessel disease.  c. 11/01/98 CABG x 3 (by Dr. Ronnald Ramp), LIMA to LAD, SVG to RCA, SVG to ramus.  d. 7/00 catheterization, 100% LIMA, patent vein grafts.  e. 7/00 & 9/00 PCI of LAD.  f. 1/01 Redo CABG (by Dr. Corinna Capra), RIMA to distal LAD, SVG to D1, EF 65% at time of cardiac catheterization.     . Chronic otitis externa 11/05/2011  . Compressed spine fracture ([place]) 11/05/2011   Overview:  11/97 Lumbar collapsed  vertebrae with impingement.    . Deafness, sensorineural 12/30/2012  . GERD (gastroesophageal reflux disease)   . Headache    enviornmental allergies  . Hearing aid worn    bilateral  . HLD (hyperlipidemia) 11/05/2011   Overview:  a. 2/07 Lipid profile:  TC 239, TG 232, HDL 35, LDL 158.  b. Many intolerances to Lipid medications.   Marland Kitchen HOH (hard of hearing)   . Mitral stenosis 12/28/2013   Overview:  Moderate per ECHO 10/14 with mean grad 4 and peak grad 40mmHg ALSO mention of mid LV gradient of 6mmHG   . Myocardial infarction [facility]) 1995 & 2017  . Prostate cancer ([facility])     Medications:  PTA Eliquis 2.5mg  bid - per pt report, has not taken a dose today  Assessment: 86 y.o. male with hx of AF on Eliquis, s/p TAVR, CAD, extremely hard of hearing, prostate cancer who presents for decreased responsiveness, confusion.  Pharmacy has been consulted to initiate and monitor a heparin drip.    Heparin Course: 1/10 0319 HL 0.41, therapeutic x 1 1/10 1219 HL 0.44, therapeutic x 2  Goal of Therapy:  Heparin level 0.3-0.7 units/ml Monitor platelets by anticoagulation protocol: Yes   Plan:  1/10  1219 HL 0.44 therapeutic x 2. Continue heparin drip at 800 units/hr. Daily HL and CBC while on drip.  Dorena Bodo, PharmD 07/18/2019 1:01 PM

## 2019-07-18 NOTE — Consult Note (Signed)
Cardiology Consultation Note    Patient ID: Richard Elliott, MRN: [id number], DOB/AGE: January 13, 1925 86 y.o. Admit date: 08/01/2019   Date of Consult: 07/18/2019 Primary Physician: Ezequiel Kayser, MD Primary Cardiologist: Dr. Queen Blossom, [facility]  Chief Complaint: atrial flutter/chest pain Reason for Consultation: atrial flutter/abnormal troponin Requesting MD: Dr. Judd Gaudier  HPI: Richard Elliott is a 86 y.o. male with history of HFpEF with preserved LV function, hyperlipidemia, hypertension, atrial flutter, coronary artery disease, aortic stenosis status post TAVR, chronic renal insufficiency presented to emergency room with chest tightness rapid heart rate noted to be in atrial flutter with a abnormal serum troponin.  Patient's cardiac history includes a PCI of the RCA in the past with a three-vessel coronary artery bypass grafting in April 2000 by Dr. Ronnald Ramp at [facility] with a left internal mammary to the LAD, saphenous vein graft to the RCA and a saphenous vein graft to the ramus.  Cardiac catheterization in July 2000 showed patent grafts.  He underwent cardiac catheterization for July 2000 in September 2000 with PCI of the LAD.  In 2001 underwent a redo coronary bypass grafting by Dr. Walker Shadow at [facility] with a right internal mammary to the distal LAD, saphenous vein graft to D1 with an ejection fraction of 65% at that time.  He had significant aortic stenosis and underwent TAVR with a EVOLUT prosthesis in December 2017 per Drs. Sheila Oats and [place].  He has been treated with Eliquis for atrial flutter.  He presented to the emergency room today with complaints of weakness and confusion.  He was noted to be hemodynamically stable but in atrial flutter with ventricular rate of 135.  His pulse ox on room air was 95%.  Serum troponin was 611 with a BNP of 252.  His creatinine was 1.32 which is at his baseline.  Electrocardiogram showed atrial fib/flutter with a rapid ventricular response.   Left bundle branch block was noted which was not previously described.  He was placed on heparin.  He has been treated with enteric-coated aspirin 81 mg daily, Eliquis 2.5 mg twice daily, Lasix 40 mg daily, losartan 50 mg daily, metoprolol tartrate 25 mg twice daily, tamsulosin 0.4 mg daily.  Chest x-ray revealed low lung volumes with no effusion or pneumothorax. Head CT showed no acute intracranial process.  He had an MRI of the brain in December of last year which revealed 3 mm subacute infarct in left frontal lobe.  He is currently hemodynamically stable with a pulse rate of 79.  He is currently on heparin, metoprolol tartrate 25 every 6 and furosemide 40 mg daily along with enteric-coated aspirin.  Patient is Covid positive.  Patient is a very difficult historian.  By report, patient's daughter has relayed that since his recent stroke, he has had poor functional status. Past Medical History:  Diagnosis Date  . Allergic rhinitis 08/04/2014  . Aortic heart valve narrowing 11/05/2011   Overview:  a.  03/2010:  moderate.  33 mmHg peak grad 24 mmHg mean grad 1.3 cm2    . Arthritis or polyarthritis, rheumatoid ([place]) 11/05/2011   wrist, hands, hips, knees  . Atrial flutter ([place]) 04/16/2013   Overview:  10/14 hosp, felt to be secondary to choking episode.  TEE and DC CV.   . Bacterial urinary infection 11/05/2011   Overview:  Urinary tract infections/prostatitis.  Followed by a urologist in [place], [place].   Marland Kitchen Benign fibroma of prostate 11/05/2011   Overview:  a. Status post prostatic  biopsy x 2 which have been negative.   . BP (high blood pressure) 11/05/2011  . CAD in native artery 08/04/2014   Overview:  a. Status post PCI of RCA x 2.  b. 4/00 Positive Myoview. Catheterization revealed EF 70% with 3-vessel disease.  c. 11/01/98 CABG x 3 (by Dr. Ronnald Ramp), LIMA to LAD, SVG to RCA, SVG to ramus.  d. 7/00 catheterization, 100% LIMA, patent vein grafts.  e. 7/00 & 9/00 PCI of LAD.  f. 1/01 Redo CABG (by Dr. Corinna Capra), RIMA  to distal LAD, SVG to D1, EF 65% at time of cardiac catheterization.     . Chronic otitis externa 11/05/2011  . Compressed spine fracture ([place]) 11/05/2011   Overview:  11/97 Lumbar collapsed vertebrae with impingement.    . Deafness, sensorineural 12/30/2012  . GERD (gastroesophageal reflux disease)   . Headache    enviornmental allergies  . Hearing aid worn    bilateral  . HLD (hyperlipidemia) 11/05/2011   Overview:  a. 2/07 Lipid profile:  TC 239, TG 232, HDL 35, LDL 158.  b. Many intolerances to Lipid medications.   Marland Kitchen HOH (hard of hearing)   . Mitral stenosis 12/28/2013   Overview:  Moderate per ECHO 10/14 with mean grad 4 and peak grad 49mmHg ALSO mention of mid LV gradient of 1mmHG   . Myocardial infarction [facility]) 1995 & 2017  . Prostate cancer [facility])       Surgical History:  Past Surgical History:  Procedure Laterality Date  . A FLUTTER ABLATION  2012  . AORTIC VALVE REPLACEMENT  06/12/2016   [facility]  . BALLOON DILATION Left 12/09/2016   Procedure: BALLOON DILATION OF LEFT EUSTACHIAN TUBE;  Surgeon: Margaretha Sheffield, MD;  Location: [facility] ORS;  Service: ENT;  Laterality: Left;  . CARDIAC CATHETERIZATION    . CARDIAC SURGERY  1991  . CORONARY ARTERY BYPASS GRAFT  1999 & 2000  . NASAL ENDOSCOPY Bilateral 12/09/2016   Procedure: NASAL ENDOSCOPY;  Surgeon: Margaretha Sheffield, MD;  Location: [facility] ORS;  Service: ENT;  Laterality: Bilateral;  . PROSTATE SURGERY       Home Meds: Prior to Admission medications   Medication Sig Start Date End Date Taking? Authorizing Provider  acetaminophen (TYLENOL) 650 MG CR tablet Take 650 mg by mouth 2 (two) times daily as needed for pain.   Yes [provider]  aspirin 81 MG tablet Take 81 mg by mouth daily.  08/22/08  Yes [provider]  Biotin 1000 MCG tablet Take 1,000 mcg by mouth 2 (two) times a week.   Yes [provider]  calcium carbonate (TUMS - DOSED IN MG ELEMENTAL CALCIUM) 500 MG chewable tablet Chew 1 tablet by mouth daily as  needed for indigestion or heartburn.   Yes [provider]  cetirizine (ZYRTEC) 10 MG tablet Take 10 mg by mouth daily.    Yes [provider]  ELIQUIS 2.5 MG TABS tablet Take 2.5 mg by mouth 2 (two) times daily.  04/02/15  Yes [provider]  finasteride (PROSCAR) 5 MG tablet TAKE 1 TABLET BY MOUTH ONCE DAILY 01/18/18  Yes McGowan, Larene Beach A, PA-C  fluticasone (FLONASE) 50 MCG/ACT nasal spray Place 1 spray into the nose at bedtime.  11/16/10  Yes [provider]  furosemide (LASIX) 40 MG tablet Take 40 mg by mouth daily. 07/12/19  Yes [provider]  Glucosamine HCl (GLUCOSAMINE PO) Take 1 tablet by mouth daily.   Yes [provider]  losartan (COZAAR) 50 MG  tablet Take 50 mg by mouth daily.  04/02/15  Yes [provider]  magnesium chloride (SLOW-MAG) 64 MG TBEC SR tablet Take 1 tablet by mouth 2 (two) times daily.    Yes [provider]  metoprolol tartrate (LOPRESSOR) 25 MG tablet Take 25 mg by mouth 2 (two) times daily.  01/04/15  Yes [provider]  mirabegron ER (MYRBETRIQ) 25 MG TB24 tablet Take 1 tablet (25 mg total) by mouth daily. 08/21/17  Yes McGowan, Larene Beach A, PA-C  montelukast (SINGULAIR) 10 MG tablet Take 10 mg by mouth daily.  04/02/15  Yes [provider]  nitroGLYCERIN (NITROSTAT) 0.4 MG SL tablet Place 1 tablet under tongue as needed for chest pain (may repeat every 5 minutes but seek medical help if pain persists after 3 tablets) as needed 08/22/08  Yes [provider]  omeprazole (PRILOSEC) 20 MG capsule Take 20 mg by mouth daily as needed (heartburn).   Yes [provider]  polyethylene glycol (MIRALAX / GLYCOLAX) packet Take 17 g by mouth daily as needed for mild constipation.    Yes [provider]  Potassium 99 MG TABS Take 99 mg by mouth daily as needed (nutritional supplementation).   Yes [provider]  senna-docusate (SENOKOT-S) 8.6-50 MG tablet Take 1  tablet by mouth at bedtime as needed for mild constipation.    Yes [provider]  tamsulosin (FLOMAX) 0.4 MG CAPS capsule TAKE 1 CAPSULE BY MOUTH ONCE DAILY 01/18/18  Yes McGowan, Larene Beach A, PA-C  vitamin B-12 (CYANOCOBALAMIN) 1000 MCG tablet Take 1,000 mcg by mouth 2 (two) times a week.    Yes [provider]  Vitamin D, Ergocalciferol, (DRISDOL) 1.25 MG (50000 UT) CAPS capsule Take 50,000 Units by mouth once a week. 07/08/19  Yes [provider]    Inpatient Medications:  . aspirin EC  81 mg Oral Daily  . finasteride  5 mg Oral Daily  . furosemide  40 mg Oral Daily  . magnesium chloride  1 tablet Oral BID  . metoprolol tartrate  25 mg Oral Q6H  . mirabegron ER  25 mg Oral Daily  . pantoprazole  40 mg Oral Daily  . tamsulosin  0.4 mg Oral Daily  . [START ON 07/19/2019] vitamin B-12  1,000 mcg Oral Once per Richard on Mon Thu   . heparin 800 Units/hr (07/30/2019 2020)    Allergies:  Allergies  Allergen Reactions  . Eggs Or Egg-Derived Products Itching    Digestive issues and itching ONLY, per daughter  . Lisinopril     Cough  . Atorvastatin     Pain to walk, stiffness in joints  . Fenofibrate     Leg Pain  . Niacin     Hot flashes  . Peanut-Containing Drug Products Itching    Eating a large of peanuts continuously. Same reaction with other tree nuts.  . Simvastatin     Pain to walk and joint stiffness in legs  . Soy Allergy Other (See Comments)    Digestive issues  . Wheat Bran     Digestive issues  . Aspirin Nausea Only     GI Upset, in high doses in uncoated aspirin   . Nitrofuran Derivatives     GI UPSET    Social History   Socioeconomic History  . Marital status: Married    Spouse name: Not on file  . Number of children: Not on file  . Years of education: Not on file  . Highest education level:  Not on file  Occupational History  . Occupation: retired  Tobacco Use  . Smoking status: Never Smoker  . Smokeless tobacco: Never Used   Substance and Sexual Activity  . Alcohol use: Yes    Alcohol/week: 0.0 standard drinks    Comment: 1-3 drink/week  . Drug use: No  . Sexual activity: Not on file  Other Topics Concern  . Not on file  Social History Narrative  . Not on file   Social Determinants of Health   Financial Resource Strain:   . Difficulty of Paying Living Expenses: Not on file  Food Insecurity:   . Worried About Charity fundraiser in the Last Year: Not on file  . Ran Out of Food in the Last Year: Not on file  Transportation Needs:   . Lack of Transportation (Medical): Not on file  . Lack of Transportation (Non-Medical): Not on file  Physical Activity:   . Days of Exercise per Week: Not on file  . Minutes of Exercise per Session: Not on file  Stress:   . Feeling of Stress : Not on file  Social Connections:   . Frequency of Communication with Friends and Family: Not on file  . Frequency of Social Gatherings with Friends and Family: Not on file  . Attends Religious Services: Not on file  . Active Member of Clubs or Organizations: Not on file  . Attends Archivist Meetings: Not on file  . Marital Status: Not on file  Intimate Partner Violence:   . Fear of Current or Ex-Partner: Not on file  . Emotionally Abused: Not on file  . Physically Abused: Not on file  . Sexually Abused: Not on file     Family History  Problem Relation Age of Onset  . Heart failure Mother   . Leukemia Father   . Hypertension Paternal Grandmother   . Heart attack Maternal Grandfather   . Breast cancer Sister   . Kidney disease Neg Hx   . Prostate cancer Neg Hx   . Kidney cancer Neg Hx   . Bladder Cancer Neg Hx      Review of Systems: A 12-system review of systems was performed and is negative except as noted in the HPI.  Labs: No results for input(s): CKTOTAL, CKMB, TROPONINI in the last 72 hours. Lab Results  Component Value Date   WBC 6.6 07/18/2019   HGB 10.8 (L) 07/18/2019   HCT 32.5 (L)  07/18/2019   MCV 96.4 07/18/2019   PLT 169 07/18/2019    Recent Labs  Lab 07/09/2019 1755 07/18/19 0632  NA 139 138  K 3.8 3.6  CL 104 105  CO2 21* 20*  BUN 26* 23  CREATININE 1.32* 1.35*  CALCIUM 9.5 8.8*  PROT 7.9  --   BILITOT 1.3*  --   ALKPHOS 43  --   ALT 21  --   AST 47*  --   GLUCOSE 130* 110*   Lab Results  Component Value Date   CHOL 210 (H) 07/18/2019   HDL 28 (L) 07/18/2019   LDLCALC 152 (H) 07/18/2019   TRIG 149 07/18/2019   No results found for: DDIMER  Radiology/Studies:  CT HEAD WO CONTRAST  Result Date: 07/12/2019 CLINICAL DATA:  Weakness and altered mental status EXAM: CT HEAD WITHOUT CONTRAST TECHNIQUE: Contiguous axial images were obtained from the base of the skull through the vertex without intravenous contrast. COMPARISON:  CT head dated 03/27/2016. FINDINGS: Brain: No evidence of acute infarction, hemorrhage,  hydrocephalus, extra-axial collection or mass lesion/mass effect. Periventricular white matter hypoattenuation likely represents chronic small vessel ischemic disease. Vascular: There are vascular calcifications in the carotid siphons. Skull: Normal. Negative for fracture or focal lesion. Sinuses/Orbits: The patient is status post a left mastoidectomy. Other: None. IMPRESSION: 1. No acute intracranial process. Electronically Signed   By: Zerita Boers M.D.   On: 07/11/2019 18:48   MR BRAIN WO CONTRAST  Result Date: 06/25/2019 CLINICAL DATA:  Dizziness. Additional history provided: Dizziness for 1 week. EXAM: MRI HEAD WITHOUT CONTRAST TECHNIQUE: Multiplanar, multiecho pulse sequences of the brain and surrounding structures were obtained without intravenous contrast. COMPARISON:  Head CT 03/27/2016 FINDINGS: Brain: 3 mm focus of cortical restricted diffusion within the right superior frontal gyrus (series 5, image 33) consistent with acute/early subacute infarct. No evidence of intracranial mass. No midline shift or extra-axial fluid collection. No  chronic intracranial blood products. Chronic lacunar infarcts within the left corona radiata/superior basal ganglia, as well as left caudate head. Chronic lacunar infarcts also present within the bilateral cerebellar hemispheres. Background of otherwise mild chronic small vessel ischemic disease. Mild generalized parenchymal atrophy. Vascular: Flow voids maintained within the proximal large arterial vessels. Skull and upper cervical spine: No focal marrow lesion. Ligamentous hypertrophy at the craniocervical junction and along the posterior aspect of the dens with resultant mild-to-moderate narrowing of the craniocervical junction and spinal canal at the C1-C2 level. No mass effect upon the caudal medulla or upper cervical spinal cord. Sinuses/Orbits: Bilateral lens replacements. Minimal ethmoid sinus mucosal thickening. Left mastoid effusion. Impression #1 below will be called to the ordering clinician or representative by the Radiologist Assistant, and communication documented in the PACS or zVision Dashboard. IMPRESSION: 3 mm acute/early subacute left frontal lobe cortical infarct. Chronic supratentorial and infratentorial lacunar infarcts as described. Background of otherwise mild chronic small vessel ischemic disease. Prominent ligamentous hypertrophy at the craniocervical junction and along the posterior aspect of the dens. Resultant mild-to-moderate narrowing of the craniocervical junction and spinal canal at the C1-C2 level. No mass effect upon the caudal medulla or upper cervical spinal cord. Left mastoid effusion. Electronically Signed   By: Kellie Simmering DO   On: 06/25/2019 09:13   DG Chest Port 1 View  Result Date: 08/07/2019 CLINICAL DATA:  Altered mental status EXAM: PORTABLE CHEST 1 VIEW COMPARISON:  Chest radiograph dated 03/29/2016 FINDINGS: The heart is normal in size. Median sternotomy wires are redemonstrated. The lungs are hypoinflated. There is mild bibasilar atelectasis/airspace disease.  There is no pleural effusion or pneumothorax. Degenerative changes are seen in both shoulders. IMPRESSION: 1. Low lung volumes with mild bibasilar atelectasis/airspace disease. Electronically Signed   By: Zerita Boers M.D.   On: 08/06/2019 19:16    Wt Readings from Last 3 Encounters:  07/16/2019 65.8 kg  12/16/17 76.8 kg  11/27/17 75.3 kg    EKG: Atrial fibrillation with rapid ventricular response.  Left bundle branch block.  Telemetry currently reveals probable sinus rhythm.  Physical Exam:  Blood pressure (!) 127/51, pulse 79, temperature 99.3 F (37.4 C), temperature source Oral, resp. rate (!) 26, height 5\' 8"  (1.727 m), weight 65.8 kg, SpO2 99 %. Body mass index is 22.05 kg/m. General: Well developed, well nourished, in no acute distress. Head: Normocephalic, atraumatic, sclera non-icteric, no xanthomas, nares are without discharge. Very hard of hearing Neck: Negative for carotid bruits. JVD not elevated. Lungs: Clear bilaterally to auscultation without wheezes, rales, or rhonchi. Breathing is unlabored. Heart: RRR with S1 S2. No murmurs, rubs,  or gallops appreciated. Abdomen: Soft, non-tender, non-distended with normoactive bowel sounds. No hepatomegaly. No rebound/guarding. No obvious abdominal masses. Msk:  Strength and tone appear normal for age. Extremities: No clubbing or cyanosis. No edema.  Distal pedal pulses are 2+ and equal bilaterally. Neuro: Alert and oriented X 3. No facial asymmetry. No focal deficit. Moves all extremities spontaneously. Psych: Somewhat inappropriate response..     Assessment and Plan  86 year old male with history of HFpEF, hyperlipidemia, atrial flutter, coronary disease status post coronary bypass grafting x2 most recent in 2001, status post TAVR in December 2017, history of atrial flutter admitted with altered mental status.  In the emergency room was noted to be somewhat confused.  Was in atrial flutter with rapid ventricular response.  Troponin  was drawn and is elevated at 611.  Patient gives no history of chest pain however he is a somewhat difficult historian.  Is Covid positive.  Coronary artery disease-status post coronary bypass grafting on 2 occasions with most recent done in 2001.  Elevated troponin likely is demand ischemia.  Patient has a bundle branch block however her symptoms are not consistent with an acute coronary syndrome.  Patient is not a candidate for invasive cardiac cath at present given multiple comorbidities and advanced age.  Will attempt medical management of this for now.  Okay to use heparin for 24 hours however will likely discontinue in the morning and resume Eliquis.  Atrial fibrillation/flutter was in A. fib flutter with rapid ventricular response on presentation.  During my exam monitor revealed probable sinus rhythm in the 80s.  We will continue with metoprolol at 25 mg every 6 for now and follow.  Will resume Eliquis at 2.5 mg twice daily when off of heparin.  Altered mental status-etiology unclear.  Patient has had CVA in the past.  Does not appear to have had an acute stroke based on CT.  Consideration for neurologic evaluation based on course.  Aortic valve disease-status post TAVR.  Being followed at [facility] for this.  Would continue current regimen including metoprolol tartrate 25 every 6 and heparin as well as oral Lasix for now.  Continue to treat Covid symptoms.  We will follow with you and make further recommendations based on course.   Signed, Teodoro Spray MD 07/18/2019, 12:31 PM Pager: [phone]

## 2019-07-18 NOTE — ED Notes (Signed)
Daughter at bedside.

## 2019-07-18 NOTE — Consult Note (Signed)
ANTICOAGULATION CONSULT NOTE - Initial Consult  Pharmacy Consult for heparin drip Indication: chest pain/ACS/NSTEMI  Allergies  Allergen Reactions  . Eggs Or Egg-Derived Products Itching    Digestive issues and itching ONLY, per daughter  . Lisinopril     Cough  . Atorvastatin     Pain to walk, stiffness in joints  . Fenofibrate     Leg Pain  . Niacin     Hot flashes  . Peanut-Containing Drug Products Itching    Eating a large of peanuts continuously. Same reaction with other tree nuts.  . Simvastatin     Pain to walk and joint stiffness in legs  . Soy Allergy Other (See Comments)    Digestive issues  . Wheat Bran     Digestive issues  . Aspirin Nausea Only     GI Upset, in high doses in uncoated aspirin   . Nitrofuran Derivatives     GI UPSET    Patient Measurements: Height: 5\' 8"  (172.7 cm) Weight: 145 lb (65.8 kg) IBW/kg (Calculated) : 68.4 Heparin Dosing Weight: 68.4kg  Vital Signs: Temp: 99.3 F (37.4 C) (01/09 2016) Temp Source: Oral (01/09 2016) BP: 114/56 (01/10 0400) Pulse Rate: 77 (01/10 0400)  Labs: Recent Labs    07/21/2019 1755 07/31/2019 2007 07/18/19 0319  HGB 11.9*  --   --   HCT 36.3*  --   --   PLT 202  --   --   APTT  --  48*  --   LABPROT  --  13.3  --   INR  --  1.0  --   HEPARINUNFRC  --  <0.10* 0.41  CREATININE 1.32*  --   --   TROPONINIHS 611*  --   --     Estimated Creatinine Clearance: 33.2 mL/min (A) (by C-G formula based on SCr of 1.32 mg/dL (H)).   Medical History: Past Medical History:  Diagnosis Date  . Allergic rhinitis 08/04/2014  . Aortic heart valve narrowing 11/05/2011   Overview:  a.  03/2010:  moderate.  33 mmHg peak grad 24 mmHg mean grad 1.3 cm2    . Arthritis or polyarthritis, rheumatoid ([place]) 11/05/2011   wrist, hands, hips, knees  . Atrial flutter ([place]) 04/16/2013   Overview:  10/14 hosp, felt to be secondary to choking episode.  TEE and DC CV.   . Bacterial urinary infection 11/05/2011   Overview:  Urinary  tract infections/prostatitis.  Followed by a urologist in [place], [place].   Marland Kitchen Benign fibroma of prostate 11/05/2011   Overview:  a. Status post prostatic biopsy x 2 which have been negative.   . BP (high blood pressure) 11/05/2011  . CAD in native artery 08/04/2014   Overview:  a. Status post PCI of RCA x 2.  b. 4/00 Positive Myoview. Catheterization revealed EF 70% with 3-vessel disease.  c. 11/01/98 CABG x 3 (by Dr. Ronnald Ramp), LIMA to LAD, SVG to RCA, SVG to ramus.  d. 7/00 catheterization, 100% LIMA, patent vein grafts.  e. 7/00 & 9/00 PCI of LAD.  f. 1/01 Redo CABG (by Dr. Corinna Capra), RIMA to distal LAD, SVG to D1, EF 65% at time of cardiac catheterization.     . Chronic otitis externa 11/05/2011  . Compressed spine fracture ([place]) 11/05/2011   Overview:  11/97 Lumbar collapsed vertebrae with impingement.    . Deafness, sensorineural 12/30/2012  . GERD (gastroesophageal reflux disease)   . Headache    enviornmental allergies  . Hearing aid worn  bilateral  . HLD (hyperlipidemia) 11/05/2011   Overview:  a. 2/07 Lipid profile:  TC 239, TG 232, HDL 35, LDL 158.  b. Many intolerances to Lipid medications.   Marland Kitchen HOH (hard of hearing)   . Mitral stenosis 12/28/2013   Overview:  Moderate per ECHO 10/14 with mean grad 4 and peak grad 29mmHg ALSO mention of mid LV gradient of 43mmHG   . Myocardial infarction [facility]) 1995 & 2017  . Prostate cancer ([place])     Medications:  PTA Eliquis 2.5mg  bid - per pt report, has not taken a dose today  Assessment: 86 y.o. male with hx of AF on Eliquis, s/p TAVR, CAD, extremely hard of hearing, prostate cancer who presents for decreased responsiveness, confusion.  Pharmacy has been consulted to initiate and monitor a heparin drip.    Heparin Course: 1/10 0319 HL 0.41, therapeutic x 1  Goal of Therapy:  Heparin level 0.3-0.7 units/ml Monitor platelets by anticoagulation protocol: Yes   Plan:  1/10 0319 HL 0.41 is therapeutic. Will continue with current rate of 800 units/hr  and check another HL in 8 hours for confirmation. Daily CBC while on Heparin.  Paulina Fusi, PharmD, BCPS 07/18/2019 4:35 AM

## 2019-07-18 NOTE — ED Notes (Signed)
Per Colletta [place], RN they cannot take pt right now

## 2019-07-18 NOTE — ED Notes (Signed)
Tele sitter in place, report given to telesitter.

## 2019-07-18 NOTE — ED Notes (Signed)
Spoke with MD, Richard Elliott- states he will come exam pt. Heparin paused per his request. [place] applied to pt for safety- pt is confused and attempts to remove NR.

## 2019-07-18 NOTE — Progress Notes (Addendum)
Notified by nursing that patient had a coughing spell with emesis and apparent aspiration.  Briefly patient is a 86 year old male with history of HFrEF, atrial flutter, CAD s/p CABG, s/p TAVR and noted to be COVID-19 positive who is admitted with altered mental status, A. fib RVR, and NSTEMI.  Per nursing, patient has been doing well requiring only 2 L supplemental O2 via [place] until he had an episode of emesis which he was felt to have aspirated on.  He has been coughing up bright red sputum.  He has now increased oxygen requirement requiring a nonrebreather at 15 L.  Stat portable chest x-ray was ordered by ED physician and reviewed by myself at bedside, shows interval right-sided infiltrate compared to previous x-ray, official radiology read pending.  Patient is seen at bedside.  He has frequent coughing spells and upper airway gurgling sounds.  Bright red sputum is noted in the suction container.  He is awake but continues to be altered and not following commands.  He is requiring a Public relations account executive and mittens for safety.  He is tachycardic with heart rate in the 140s.  No discernible abdominal discomfort on palpation.  Moving all extremities spontaneously.  No peripheral edema.  We will hold heparin and aspirin with apparent hemoptysis, restart as soon as able given history of TAVR.  Start IV Unasyn for likely aspiration pneumonia versus pneumonitis.  Keep n.p.o.  Will need continued upper airway suctioning.  Will order IV metoprolol as needed for heart rate control.  Wean down oxygen as able.  Addendum: Patient with continued respiratory distress, tachypnea, and hypoxia despite nonrebreather.  Patient noted to be full code and due to respiratory failure the decision was made to intubate by the ED physician.  He will need transfer to the ICU.  I discussed with on-call ICU coverage.  Appreciate emergency and critical care medicine assistance in the care of this patient.

## 2019-07-19 ENCOUNTER — Inpatient Hospital Stay
Admit: 2019-07-19 | Discharge: 2019-07-19 | Disposition: A | Discharge status: Home / Self Care | Payer: Medicare HMO | Attending: Internal Medicine | Admitting: Internal Medicine

## 2019-07-19 ENCOUNTER — Inpatient Hospital Stay: Payer: Medicare HMO

## 2019-07-19 DIAGNOSIS — J9601 Acute respiratory failure with hypoxia: Secondary | ICD-10-CM

## 2019-07-19 DIAGNOSIS — I214 Non-ST elevation (NSTEMI) myocardial infarction: Secondary | ICD-10-CM

## 2019-07-19 DIAGNOSIS — G9341 Metabolic encephalopathy: Secondary | ICD-10-CM

## 2019-07-19 LAB — RESPIRATORY PANEL BY PCR

## 2019-07-19 LAB — BLOOD GAS, ARTERIAL
Acid-base deficit: 3.3 mmol/L — ABNORMAL HIGH (ref 0.0–2.0)
Bicarbonate: 22.1 mmol/L (ref 20.0–28.0)
FIO2: 0.7
MECHVT: 500 mL
O2 Saturation: 98.3 %
PEEP: 5 cmH2O
Patient temperature: 37
RATE: 18 resp/min
pCO2 arterial: 40 mmHg (ref 32.0–48.0)
pH, Arterial: 7.35 (ref 7.350–7.450)
pO2, Arterial: 116 mmHg — ABNORMAL HIGH (ref 83.0–108.0)

## 2019-07-19 LAB — COMPREHENSIVE METABOLIC PANEL
ALT: 21 U/L (ref 0–44)
AST: 57 U/L — ABNORMAL HIGH (ref 15–41)
Albumin: 2.8 g/dL — ABNORMAL LOW (ref 3.5–5.0)
Alkaline Phosphatase: 34 U/L — ABNORMAL LOW (ref 38–126)
Anion gap: 11 (ref 5–15)
BUN: 32 mg/dL — ABNORMAL HIGH (ref 8–23)
CO2: 21 mmol/L — ABNORMAL LOW (ref 22–32)
Calcium: 8.3 mg/dL — ABNORMAL LOW (ref 8.9–10.3)
Chloride: 105 mmol/L (ref 98–111)
Creatinine, Ser: 1.82 mg/dL — ABNORMAL HIGH (ref 0.61–1.24)
GFR calc Af Amer: 37 mL/min — ABNORMAL LOW (ref >60)
GFR calc non Af Amer: 32 mL/min — ABNORMAL LOW (ref >60)
Glucose, Bld: 182 mg/dL — ABNORMAL HIGH (ref 70–99)
Potassium: 4.1 mmol/L (ref 3.5–5.1)
Sodium: 137 mmol/L (ref 135–145)
Total Bilirubin: 1 mg/dL (ref 0.3–1.2)
Total Protein: 6.4 g/dL — ABNORMAL LOW (ref 6.5–8.1)

## 2019-07-19 LAB — LACTIC ACID, PLASMA
Lactic Acid, Venous: 2.7 mmol/L (ref 0.5–1.9)
Lactic Acid, Venous: 3.1 mmol/L (ref 0.5–1.9)

## 2019-07-19 LAB — CBC WITH DIFFERENTIAL/PLATELET
Abs Immature Granulocytes: 0.06 10*3/uL (ref 0.00–0.07)
Basophils Absolute: 0 10*3/uL (ref 0.0–0.1)
Basophils Relative: 0 %
Eosinophils Absolute: 0 10*3/uL (ref 0.0–0.5)
Eosinophils Relative: 0 %
HCT: 33.5 % — ABNORMAL LOW (ref 39.0–52.0)
Hemoglobin: 10.9 g/dL — ABNORMAL LOW (ref 13.0–17.0)
Immature Granulocytes: 1 %
Lymphocytes Relative: 4 %
Lymphs Abs: 0.5 10*3/uL — ABNORMAL LOW (ref 0.7–4.0)
MCH: 31.8 pg (ref 26.0–34.0)
MCHC: 32.5 g/dL (ref 30.0–36.0)
MCV: 97.7 fL (ref 80.0–100.0)
Monocytes Absolute: 0.2 10*3/uL (ref 0.1–1.0)
Monocytes Relative: 2 %
Neutro Abs: 11.5 10*3/uL — ABNORMAL HIGH (ref 1.7–7.7)
Neutrophils Relative %: 93 %
Platelets: 196 10*3/uL (ref 150–400)
RBC: 3.43 MIL/uL — ABNORMAL LOW (ref 4.22–5.81)
RDW: 13.9 % (ref 11.5–15.5)
WBC: 12.2 10*3/uL — ABNORMAL HIGH (ref 4.0–10.5)
nRBC: 0 % (ref 0.0–0.2)

## 2019-07-19 LAB — TROPONIN I (HIGH SENSITIVITY)
Troponin I (High Sensitivity): 3030 ng/L (ref <18)
Troponin I (High Sensitivity): 4729 ng/L (ref <18)

## 2019-07-19 LAB — URINE CULTURE

## 2019-07-19 LAB — GLUCOSE, CAPILLARY
Glucose-Capillary: 121 mg/dL — ABNORMAL HIGH (ref 70–99)
Glucose-Capillary: 125 mg/dL — ABNORMAL HIGH (ref 70–99)
Glucose-Capillary: 128 mg/dL — ABNORMAL HIGH (ref 70–99)
Glucose-Capillary: 137 mg/dL — ABNORMAL HIGH (ref 70–99)
Glucose-Capillary: 137 mg/dL — ABNORMAL HIGH (ref 70–99)
Glucose-Capillary: 148 mg/dL — ABNORMAL HIGH (ref 70–99)

## 2019-07-19 LAB — HEMOGLOBIN A1C
Hgb A1c MFr Bld: 6.3 % — ABNORMAL HIGH (ref 4.8–5.6)
Mean Plasma Glucose: 134.11 mg/dL

## 2019-07-19 LAB — C-REACTIVE PROTEIN: CRP: 14.4 mg/dL — ABNORMAL HIGH (ref <1.0)

## 2019-07-19 LAB — PHOSPHORUS: Phosphorus: 3.9 mg/dL (ref 2.5–4.6)

## 2019-07-19 LAB — ECHOCARDIOGRAM COMPLETE
Height: 72 in
Weight: 2712.54 oz

## 2019-07-19 LAB — FIBRIN DERIVATIVES D-DIMER (ARMC ONLY): Fibrin derivatives D-dimer (ARMC): 3024.46 ng/mL (FEU) — ABNORMAL HIGH (ref 0.00–499.00)

## 2019-07-19 LAB — PROTIME-INR
INR: 1.1 (ref 0.8–1.2)
Prothrombin Time: 14 seconds (ref 11.4–15.2)

## 2019-07-19 LAB — FIBRINOGEN: Fibrinogen: 692 mg/dL — ABNORMAL HIGH (ref 210–475)

## 2019-07-19 LAB — HEPARIN LEVEL (UNFRACTIONATED): Heparin Unfractionated: 0.2 IU/mL — ABNORMAL LOW (ref 0.30–0.70)

## 2019-07-19 LAB — FERRITIN: Ferritin: 1282 ng/mL — ABNORMAL HIGH (ref 24–336)

## 2019-07-19 LAB — MAGNESIUM: Magnesium: 1.7 mg/dL (ref 1.7–2.4)

## 2019-07-19 LAB — ABO/RH: ABO/RH(D): A POS

## 2019-07-19 LAB — LACTATE DEHYDROGENASE: LDH: 318 U/L — ABNORMAL HIGH (ref 98–192)

## 2019-07-19 LAB — MRSA PCR SCREENING: MRSA by PCR: NEGATIVE

## 2019-07-19 LAB — APTT: aPTT: 95 seconds — ABNORMAL HIGH (ref 24–36)

## 2019-07-19 LAB — PROCALCITONIN: Procalcitonin: 3.34 ng/mL

## 2019-07-19 MED ORDER — LACTATED RINGERS IV SOLN: INTRAVENOUS | Status: DC

## 2019-07-19 MED ORDER — ZINC SULFATE 220 (50 ZN) MG PO CAPS
220.0000 mg | ORAL_CAPSULE | Freq: Every day | ORAL | Status: DC
  Administered 2019-07-19 – 2019-07-22 (×4): 220 mg
  Filled 2019-07-19 (×4): qty 1

## 2019-07-19 MED ORDER — FENTANYL 2500MCG IN NS 250ML (10MCG/ML) PREMIX INFUSION
0.0000 ug/h | INTRAVENOUS | Status: DC
  Administered 2019-07-19: 25 ug/h via INTRAVENOUS
  Administered 2019-07-19 – 2019-07-20 (×2): 300 ug/h via INTRAVENOUS
  Administered 2019-07-21: 400 ug/h via INTRAVENOUS
  Administered 2019-07-21: 350 ug/h via INTRAVENOUS
  Administered 2019-07-21: 300 ug/h via INTRAVENOUS
  Administered 2019-07-21: 400 ug/h via INTRAVENOUS
  Administered 2019-07-22: 350 ug/h via INTRAVENOUS
  Administered 2019-07-22: 400 ug/h via INTRAVENOUS
  Filled 2019-07-19 (×10): qty 250

## 2019-07-19 MED ORDER — LACTATED RINGERS IV BOLUS
1000.0000 mL | Freq: Once | INTRAVENOUS | Status: AC
  Administered 2019-07-19: 1000 mL via INTRAVENOUS

## 2019-07-19 MED ORDER — VECURONIUM BROMIDE 10 MG IV SOLR
INTRAVENOUS | Status: AC
  Administered 2019-07-19: 10 mg via INTRAVENOUS
  Filled 2019-07-19: qty 10

## 2019-07-19 MED ORDER — ENOXAPARIN SODIUM 80 MG/0.8ML ~~LOC~~ SOLN
1.0000 mg/kg | Freq: Every day | SUBCUTANEOUS | Status: DC
  Administered 2019-07-19 – 2019-07-20 (×2): 75 mg via SUBCUTANEOUS
  Filled 2019-07-19 (×2): qty 0.8

## 2019-07-19 MED ORDER — FENTANYL CITRATE (PF) 100 MCG/2ML IJ SOLN
200.0000 ug | Freq: Once | INTRAMUSCULAR | Status: AC
  Administered 2019-07-19: 200 ug via INTRAVENOUS

## 2019-07-19 MED ORDER — FOLIC ACID 1 MG PO TABS
1.0000 mg | ORAL_TABLET | Freq: Every day | ORAL | Status: DC
  Administered 2019-07-20 – 2019-07-22 (×3): 1 mg
  Filled 2019-07-19 (×3): qty 1

## 2019-07-19 MED ORDER — SODIUM CHLORIDE 0.9 % IV SOLN
25.0000 ug/min | INTRAVENOUS | Status: DC
  Administered 2019-07-19 – 2019-07-20 (×5): 50 ug/min via INTRAVENOUS
  Administered 2019-07-22: 15 ug/min via INTRAVENOUS
  Filled 2019-07-19: qty 10
  Filled 2019-07-19: qty 1
  Filled 2019-07-19 (×2): qty 10
  Filled 2019-07-19 (×2): qty 1
  Filled 2019-07-19: qty 10

## 2019-07-19 MED ORDER — SODIUM CHLORIDE 0.9 % IV SOLN
3.0000 g | Freq: Two times a day (BID) | INTRAVENOUS | Status: DC
  Administered 2019-07-19 – 2019-07-20 (×3): 3 g via INTRAVENOUS
  Filled 2019-07-19: qty 3
  Filled 2019-07-19 (×2): qty 8
  Filled 2019-07-19: qty 3

## 2019-07-19 MED ORDER — VECURONIUM BROMIDE 10 MG IV SOLR: 10.0000 mg | Freq: Once | INTRAVENOUS | Status: AC

## 2019-07-19 MED ORDER — PRO-STAT SUGAR FREE PO LIQD
60.0000 mL | Freq: Every day | ORAL | Status: DC
  Administered 2019-07-19 – 2019-07-22 (×4): 60 mL

## 2019-07-19 MED ORDER — INSULIN ASPART 100 UNIT/ML ~~LOC~~ SOLN
0.0000 [IU] | SUBCUTANEOUS | Status: DC
  Administered 2019-07-19: 2 [IU] via SUBCUTANEOUS
  Administered 2019-07-19 – 2019-07-20 (×6): 1 [IU] via SUBCUTANEOUS
  Administered 2019-07-20: 2 [IU] via SUBCUTANEOUS
  Administered 2019-07-20: 1 [IU] via SUBCUTANEOUS
  Administered 2019-07-20 (×3): 2 [IU] via SUBCUTANEOUS
  Administered 2019-07-21 (×6): 3 [IU] via SUBCUTANEOUS
  Administered 2019-07-22: 5 [IU] via SUBCUTANEOUS
  Administered 2019-07-22 (×2): 3 [IU] via SUBCUTANEOUS
  Administered 2019-07-22: 5 [IU] via SUBCUTANEOUS
  Filled 2019-07-19 (×22): qty 1

## 2019-07-19 MED ORDER — THIAMINE HCL 100 MG PO TABS: 100.0000 mg | ORAL_TABLET | Freq: Every day | ORAL | Status: DC

## 2019-07-19 MED ORDER — VITAL HIGH PROTEIN PO LIQD: 1000.0000 mL | ORAL | Status: DC

## 2019-07-19 MED ORDER — THIAMINE HCL 100 MG PO TABS
100.0000 mg | ORAL_TABLET | Freq: Every day | ORAL | Status: DC
  Administered 2019-07-20 – 2019-07-22 (×3): 100 mg
  Filled 2019-07-19 (×4): qty 1

## 2019-07-19 MED ORDER — SODIUM CHLORIDE 0.9 % IV SOLN: 250.0000 mL | INTRAVENOUS | Status: DC

## 2019-07-19 MED ORDER — MAGNESIUM SULFATE 2 GM/50ML IV SOLN
2.0000 g | Freq: Once | INTRAVENOUS | Status: AC
  Administered 2019-07-19: 2 g via INTRAVENOUS
  Filled 2019-07-19: qty 50

## 2019-07-19 MED ORDER — VITAL 1.5 CAL PO LIQD
1000.0000 mL | ORAL | Status: DC
  Administered 2019-07-19 – 2019-07-21 (×2): 1000 mL

## 2019-07-19 MED ORDER — MIDAZOLAM HCL 2 MG/2ML IJ SOLN: 4.0000 mg | Freq: Once | INTRAMUSCULAR | Status: AC

## 2019-07-19 MED ORDER — MIDAZOLAM HCL 2 MG/2ML IJ SOLN
INTRAMUSCULAR | Status: AC
  Administered 2019-07-19: 4 mg via INTRAVENOUS
  Filled 2019-07-19: qty 4

## 2019-07-19 MED ORDER — PHENYLEPHRINE HCL-NACL 10-0.9 MG/250ML-% IV SOLN
25.0000 ug/min | INTRAVENOUS | Status: DC
  Filled 2019-07-19: qty 250

## 2019-07-19 MED ORDER — VITAMIN D3 25 MCG PO TABS
1000.0000 [IU] | ORAL_TABLET | Freq: Every day | ORAL | Status: DC
  Administered 2019-07-19 – 2019-07-22 (×4): 1000 [IU]
  Filled 2019-07-19 (×8): qty 1

## 2019-07-19 MED ORDER — INSULIN ASPART 100 UNIT/ML ~~LOC~~ SOLN
SUBCUTANEOUS | Status: AC
  Filled 2019-07-19: qty 1

## 2019-07-19 MED ORDER — FAMOTIDINE 20 MG PO TABS
20.0000 mg | ORAL_TABLET | Freq: Every day | ORAL | Status: DC
  Administered 2019-07-20 – 2019-07-22 (×3): 20 mg
  Filled 2019-07-19 (×3): qty 1

## 2019-07-19 MED ORDER — HEPARIN (PORCINE) 25000 UT/250ML-% IV SOLN
800.0000 [IU]/h | INTRAVENOUS | Status: DC
  Administered 2019-07-19: 800 [IU]/h via INTRAVENOUS
  Filled 2019-07-19: qty 250

## 2019-07-19 NOTE — Progress Notes (Signed)
PHARMACY NOTE:  ANTIMICROBIAL RENAL DOSAGE ADJUSTMENT  Current antimicrobial regimen includes a mismatch between antimicrobial dosage and estimated renal function.  As per policy approved by the Pharmacy & Therapeutics and Medical Executive Committees, the antimicrobial dosage will be adjusted accordingly.  Current antimicrobial dosage:  Unasyn 3g IV q6h  Indication: aspiration  Renal Function:  Estimated Creatinine Clearance: 28.2 mL/min (A) (by C-G formula based on SCr of 1.82 mg/dL (H)). []      On intermittent HD, scheduled: []      On CRRT    Antimicrobial dosage has been changed to:  Unasyn 3g IV q12h  Additional comments:   Thank you for allowing pharmacy to be a part of this patient's care.  Vira Blanco, [facility] 07/19/2019 6:44 AM

## 2019-07-19 NOTE — Consult Note (Signed)
[place] for Enoxaparin Indication: afib  Allergies  Allergen Reactions  . Eggs Or Egg-Derived Products Itching    Digestive issues and itching ONLY, per daughter  . Lisinopril     Cough  . Atorvastatin     Pain to walk, stiffness in joints  . Fenofibrate     Leg Pain  . Niacin     Hot flashes  . Peanut-Containing Drug Products Itching    Eating a large of peanuts continuously. Same reaction with other tree nuts.  . Simvastatin     Pain to walk and joint stiffness in legs  . Soy Allergy Other (See Comments)    Digestive issues  . Wheat Bran     Digestive issues  . Aspirin Nausea Only     GI Upset, in high doses in uncoated aspirin   . Nitrofuran Derivatives     GI UPSET    Patient Measurements: Height: 6' (182.9 cm) Weight: 169 lb 8.5 oz (76.9 kg) IBW/kg (Calculated) : 77.6 Heparin Dosing Weight: 68.4kg  Vital Signs: Temp: 99.3 F (37.4 C) (01/11 1300) Temp Source: Bladder (01/11 1200) BP: 120/59 (01/11 1300) Pulse Rate: 106 (01/11 1300)  Labs: Recent Labs    08/01/2019 1755 07/16/2019 2007 07/18/19 0319 07/18/19 [id number] 07/18/19 1219 07/18/19 2350 07/19/19 0333 07/19/19 0641 07/19/19 1245  HGB 11.9*  --   --  10.8*  --   --  10.9*  --   --   HCT 36.3*  --   --  32.5*  --   --  33.5*  --   --   PLT 202  --   --  169  --   --  196  --   --   APTT  --  48*  --   --   --  95*  --   --   --   LABPROT  --  13.3  --   --   --  14.0  --   --   --   INR  --  1.0  --   --   --  1.1  --   --   --   HEPARINUNFRC  --  <0.10* 0.41  --  0.44  --   --   --  0.20*  CREATININE 1.32*  --   --  1.35*  --  1.82*  --   --   --   TROPONINIHS 611*  --   --   --   --  3,030*  --  4,729*  --     Estimated Creatinine Clearance: 28.2 mL/min (A) (by C-G formula based on SCr of 1.82 mg/dL (H)).   Medical History: Past Medical History:  Diagnosis Date  . Allergic rhinitis 08/04/2014  . Aortic heart valve narrowing 11/05/2011   Overview:  a.   03/2010:  moderate.  33 mmHg peak grad 24 mmHg mean grad 1.3 cm2    . Arthritis or polyarthritis, rheumatoid ([place]) 11/05/2011   wrist, hands, hips, knees  . Atrial flutter ([place]) 04/16/2013   Overview:  10/14 hosp, felt to be secondary to choking episode.  TEE and DC CV.   . Bacterial urinary infection 11/05/2011   Overview:  Urinary tract infections/prostatitis.  Followed by a urologist in [place], [place].   Marland Kitchen Benign fibroma of prostate 11/05/2011   Overview:  a. Status post prostatic biopsy x 2 which have been negative.   . BP (high blood pressure) 11/05/2011  . CAD in  native artery 08/04/2014   Overview:  a. Status post PCI of RCA x 2.  b. 4/00 Positive Myoview. Catheterization revealed EF 70% with 3-vessel disease.  c. 11/01/98 CABG x 3 (by Dr. Ronnald Ramp), LIMA to LAD, SVG to RCA, SVG to ramus.  d. 7/00 catheterization, 100% LIMA, patent vein grafts.  e. 7/00 & 9/00 PCI of LAD.  f. 1/01 Redo CABG (by Dr. Corinna Capra), RIMA to distal LAD, SVG to D1, EF 65% at time of cardiac catheterization.     . Chronic otitis externa 11/05/2011  . Compressed spine fracture ([place]) 11/05/2011   Overview:  11/97 Lumbar collapsed vertebrae with impingement.    . Deafness, sensorineural 12/30/2012  . GERD (gastroesophageal reflux disease)   . Headache    enviornmental allergies  . Hearing aid worn    bilateral  . HLD (hyperlipidemia) 11/05/2011   Overview:  a. 2/07 Lipid profile:  TC 239, TG 232, HDL 35, LDL 158.  b. Many intolerances to Lipid medications.   Marland Kitchen HOH (hard of hearing)   . Mitral stenosis 12/28/2013   Overview:  Moderate per ECHO 10/14 with mean grad 4 and peak grad 42mmHg ALSO mention of mid LV gradient of 35mmHG   . Myocardial infarction [facility]) 1995 & 2017  . Prostate cancer ([place])     Medications:  PTA Eliquis 2.5mg  bid - Last dose appears to be PTA.  Admitted 1/9.     Assessment: 86 y.o. male who is COVID positive with hx of AF on Eliquis, s/p TAVR, CAD, extremely hard of hearing, prostate cancer who presents for  decreased responsiveness, confusion.  Patient was switched from heparin to enoxaparin to minimize lab draws in the setting of COVID-19.  Anticoagulation had been held on 1/10 due to coughing episode with bloody sputum.  Hemoglobin and platelets are currently stable and increasing.  Goal of Therapy:  Monitor platelets by anticoagulation protocol: Yes   Plan:  Confirmed with RN no episodes of bleeding. Will start enoxaparin 1 mg/kg once daily as CrCl <30 mL/min.  Begin therapy 1 hour following end of heparin infusion.  Will obtain BMP with AM labs.  Gerald Dexter, PharmD 07/19/2019 2:53 PM

## 2019-07-19 NOTE — Progress Notes (Addendum)
Initial Nutrition Assessment  DOCUMENTATION CODES:   Not applicable  INTERVENTION:  Initiate Vital 1.5 Cal at 20 mL/hr and advance by 10 mL/hr every 8 hours to goal rate of 40 mL/hr (960 mL goal daily volume). Also provide Pro-Stat 60 mL once daily per tube. Goal regimen provides 1640 kcal, 95 grams of protein, 730 mL H2O daily.  Provide minimum free water flush of 20-30 mL Q4hrs to maintain tube patency.  NUTRITION DIAGNOSIS:   Increased nutrient needs related to catabolic [id number]) as evidenced by estimated needs.  GOAL:   Provide needs based on ASPEN/SCCM guidelines  MONITOR:   Vent status, Labs, Weight trends, TF tolerance, I & O's  REASON FOR ASSESSMENT:   Ventilator    ASSESSMENT:   86 year old male with PMHx of atrial flutter, CAD, deafness, HLD, hx prostate cancer, hx MI, GERD admitted with acute hypoxic respiratory failure secondary to aspiration PNA and COVID-19 requiring intubation on 1/10, also with Atrial fibrillation/flutter with RVR, AKI.   Patient is currently intubated on ventilator support MV: 11.6 L/min Temp (24hrs), Avg:102 F (38.9 C), Min:98.6 F (37 C), Max:104.7 F (40.4 C)  Propofol: N/A  Medications reviewed and include: Decadron 6 mg Q24hrs IV, famotidine, folic acid 1 mg daily IV, Novolog 0-9 units Q4hrs, thiamine 100 mg daily IV, vitamin D3 1000 units daily, zinc sulfate 220 mg daily per tube, Unasyn, fentanyl gtt, heparin, remdesivir.  Labs reviewed: CBG 128-148.  Enteral Access: OGT  Patient is at risk for malnutrition. Unable to determine if he meets criteria for malnutrition  Discussed on rounds. Plan is to start tube feeds. Unable to complete NFPE as patient is on airborne/contact precautions for COVID-19.  NUTRITION - FOCUSED PHYSICAL EXAM:  Unable to complete at this time.  Diet Order:   Diet Order            Diet NPO time specified  Diet effective now             EDUCATION NEEDS:   No education needs have  been identified at this time  Skin:  Skin Assessment: Reviewed RN Assessment  Last BM:  07/18/2019 per chart  Height:   Ht Readings from Last 1 Encounters:  07/18/19 6' (1.829 m)   Weight:   Wt Readings from Last 1 Encounters:  07/19/19 76.9 kg   Ideal Body Weight:  80.9 kg  BMI:  Body mass index is 22.99 kg/m.  Estimated Nutritional Needs:   Kcal:  1500-1700  Protein:  95-115 grams  Fluid:  1.9 L/day  Jacklynn Barnacle, MS, RD, LDN Office: [phone] Pager: [phone] After Hours/Weekend Pager: [phone]

## 2019-07-19 NOTE — Progress Notes (Signed)
*  PRELIMINARY RESULTS* Echocardiogram 2D Echocardiogram has been performed.  Richard Elliott 07/19/2019, 9:28 AM

## 2019-07-19 NOTE — Consult Note (Signed)
[place] for heparin drip Indication: afib  Allergies  Allergen Reactions  . Eggs Or Egg-Derived Products Itching    Digestive issues and itching ONLY, per daughter  . Lisinopril     Cough  . Atorvastatin     Pain to walk, stiffness in joints  . Fenofibrate     Leg Pain  . Niacin     Hot flashes  . Peanut-Containing Drug Products Itching    Eating a large of peanuts continuously. Same reaction with other tree nuts.  . Simvastatin     Pain to walk and joint stiffness in legs  . Soy Allergy Other (See Comments)    Digestive issues  . Wheat Bran     Digestive issues  . Aspirin Nausea Only     GI Upset, in high doses in uncoated aspirin   . Nitrofuran Derivatives     GI UPSET    Patient Measurements: Height: 6' (182.9 cm) Weight: 159 lb 6.3 oz (72.3 kg) IBW/kg (Calculated) : 77.6 Heparin Dosing Weight: 68.4kg  Vital Signs: Temp: 103.3 F (39.6 C) (01/10 2348) Temp Source: Bladder (01/10 2348) BP: 125/89 (01/11 0200) Pulse Rate: 110 (01/11 0200)  Labs: Recent Labs    07/15/2019 1755 08/03/2019 2007 07/18/19 0319 07/18/19 [id number] 07/18/19 1219 07/18/19 2350 07/19/19 0333  HGB 11.9*  --   --  10.8*  --   --  10.9*  HCT 36.3*  --   --  32.5*  --   --  33.5*  PLT 202  --   --  169  --   --  196  APTT  --  48*  --   --   --  95*  --   LABPROT  --  13.3  --   --   --  14.0  --   INR  --  1.0  --   --   --  1.1  --   HEPARINUNFRC  --  <0.10* 0.41  --  0.44  --   --   CREATININE 1.32*  --   --  1.35*  --  1.82*  --   TROPONINIHS 611*  --   --   --   --  3,030*  --     Estimated Creatinine Clearance: 26.5 mL/min (A) (by C-G formula based on SCr of 1.82 mg/dL (H)).   Medical History: Past Medical History:  Diagnosis Date  . Allergic rhinitis 08/04/2014  . Aortic heart valve narrowing 11/05/2011   Overview:  a.  03/2010:  moderate.  33 mmHg peak grad 24 mmHg mean grad 1.3 cm2    . Arthritis or polyarthritis, rheumatoid ([place])  11/05/2011   wrist, hands, hips, knees  . Atrial flutter ([place]) 04/16/2013   Overview:  10/14 hosp, felt to be secondary to choking episode.  TEE and DC CV.   . Bacterial urinary infection 11/05/2011   Overview:  Urinary tract infections/prostatitis.  Followed by a urologist in [place], [place].   Marland Kitchen Benign fibroma of prostate 11/05/2011   Overview:  a. Status post prostatic biopsy x 2 which have been negative.   . BP (high blood pressure) 11/05/2011  . CAD in native artery 08/04/2014   Overview:  a. Status post PCI of RCA x 2.  b. 4/00 Positive Myoview. Catheterization revealed EF 70% with 3-vessel disease.  c. 11/01/98 CABG x 3 (by Dr. Ronnald Ramp), LIMA to LAD, SVG to RCA, SVG to ramus.  d. 7/00 catheterization, 100% LIMA, patent vein  grafts.  e. 7/00 & 9/00 PCI of LAD.  f. 1/01 Redo CABG (by Dr. Corinna Capra), RIMA to distal LAD, SVG to D1, EF 65% at time of cardiac catheterization.     . Chronic otitis externa 11/05/2011  . Compressed spine fracture ([place]) 11/05/2011   Overview:  11/97 Lumbar collapsed vertebrae with impingement.    . Deafness, sensorineural 12/30/2012  . GERD (gastroesophageal reflux disease)   . Headache    enviornmental allergies  . Hearing aid worn    bilateral  . HLD (hyperlipidemia) 11/05/2011   Overview:  a. 2/07 Lipid profile:  TC 239, TG 232, HDL 35, LDL 158.  b. Many intolerances to Lipid medications.   Marland Kitchen HOH (hard of hearing)   . Mitral stenosis 12/28/2013   Overview:  Moderate per ECHO 10/14 with mean grad 4 and peak grad 6mmHg ALSO mention of mid LV gradient of 84mmHG   . Myocardial infarction [facility]) 1995 & 2017  . Prostate cancer ([facility])     Medications:  PTA Eliquis 2.5mg  bid - per pt report, has not taken a dose today  Assessment: 86 y.o. male with hx of AF on Eliquis, s/p TAVR, CAD, extremely hard of hearing, prostate cancer who presents for decreased responsiveness, confusion.  Pharmacy has been consulted to initiate and monitor a heparin drip.    Heparin Course: 1/10 0319 HL  0.41, therapeutic x 1 1/10 1219 HL 0.44, therapeutic x 2  1/10 ~21:00 Heparin infusion stopped due to coughing episode with bloody sputum.  Goal of Therapy:  Heparin level 0.3-0.7 units/ml Monitor platelets by anticoagulation protocol: Yes   Plan:  Patient has not any further episodes of hemoptysis. CCM would like to restart Heparin without a bolus. Will order Heparin 800 units/hr as patient was therapeutic on this rate. Will check HL in 8 hours. Daily CBC while on Heparin.  Paulina Fusi, PharmD, BCPS 07/19/2019 4:29 AM

## 2019-07-19 NOTE — Progress Notes (Signed)
Clinical status relayed to family  Updated and notified of patients medical condition-  Progressive multiorgan failure with very low chance of meaningful recovery.  Patient is in dying  Process.  Family understands the situation.  They have consented and agreed to DNR.   Family are satisfied with Plan of action and management. All questions answered  Richard Elliott Richard Elliott, M.D.  [facility]  Medical Director ICU-[facility] [place] Medical Director [facility] Cardio-Pulmonary Department     

## 2019-07-19 NOTE — Procedures (Signed)
Endotracheal Intubation: Patient required placement of an artificial airway secondary to Respiratory Failure  Consent: Emergent.   Hand washing performed prior to starting the procedure.   Medications administered for sedation prior to procedure:  Midazolam 4 mg IV,  Vecuronium 10 mg IV, Fentanyl 100 mcg IV.    A time out procedure was called and correct patient, name, & ID confirmed. Needed supplies and equipment were assembled and checked to include ETT, 10 ml syringe, Glidescope, Mac and Miller blades, suction, oxygen and bag mask valve, end tidal CO2 monitor.   Patient was positioned to align the mouth and pharynx to facilitate visualization of the glottis.   Heart rate, SpO2 and blood pressure was continuously monitored during the procedure. Pre-oxygenation was conducted prior to intubation and endotracheal tube was placed through the vocal cords into the trachea.     The artificial airway was placed under direct visualization via glidescope route using a 8.0 ETT on the first attempt.  ETT was secured at 23 cm mark.  Placement was confirmed by auscuitation of lungs with good breath sounds bilaterally and no stomach sounds.  Condensation was noted on endotracheal tube.   Pulse ox 98%.  CO2 detector in place with appropriate color change.   Complications: None .   Operator: Zynasia Burklow.   Chest radiograph ordered and pending.   Comments: OGT placed via glidescope.  Jenavee Laguardia David Eiko Mcgowen, M.D.  [facility]  Medical Director ICU-[facility] [place] Medical Director [facility] Cardio-Pulmonary Department       

## 2019-07-19 NOTE — ED Notes (Addendum)
Propofol started emergently immediately after intubation per MD order. No weight found on chart. This RN used bed to weigh pt and used that weight of 71 kg for infusion on pump as computer would not communicate with pump. This weight of 71 kg entered into chart for propofol order.  Pharmacy order used older weight of 65.8 kg for pt. Weight on pump adjusted to pharmacy weight at this time so infusion rate also adjusted from 6.57ml/hr to 5.92 ml/hr.

## 2019-07-19 NOTE — Progress Notes (Signed)
eLink Physician-Brief Progress Note Patient Name: Richard Elliott DOB: 1925-01-02 MRN: [id number]   Date of Service  07/19/2019  HPI/Events of Note  50M who presented to the ER with Afib with RVR and NSTEMI secondary to demand ischemia, who was subsequently diagnosed with COVID pneumonia.  Started on a heparin drip in ER. Developed emesis, aspiration, and hemoptysis in the ER requiring him to be intubated for respiratory distress. Heparin drip was subsequently held.  He is now admitted to the ICU for ongoing management.   eICU Interventions  # Neuro: - Sedation with prop/fent  # Resp: - Dexamethasone for severe COVID. Also receiving Remdesivir. - Mechanical ventilation targeting 6-7cc/kg IBW TV (I.e. 470-540cc). ARDSNet protocol. - ABX for aspiration pneumonia empirically.  # Card: - Currently ST to 114 (no longer in Afib). - Would not heparinize this patient for an NSTEMI for which no intervention is planned, especially given episode of hemoptysis in the ER. - Would hold home Eliquis as well for now.  - Note: Patient is s/p TAVR.  # GI: - NPO for now.  # Renal: - AKI likely secondary to pre-renal state. - Receiving some IVF currently. Would recommend closely re-evaluating whether these should be continued or not in AM given respiratory failure.  DVT PPX: SCDs. GI PPX: Pepcid.      Intervention Category Evaluation Type: New Patient Evaluation  Charlott Rakes 07/19/2019, 1:41 AM

## 2019-07-20 DIAGNOSIS — U071 COVID-19: Principal | ICD-10-CM

## 2019-07-20 LAB — CBC WITH DIFFERENTIAL/PLATELET
Abs Immature Granulocytes: 0.24 10*3/uL — ABNORMAL HIGH (ref 0.00–0.07)
Basophils Absolute: 0 10*3/uL (ref 0.0–0.1)
Basophils Relative: 0 %
Eosinophils Absolute: 0 10*3/uL (ref 0.0–0.5)
Eosinophils Relative: 0 %
HCT: 32.7 % — ABNORMAL LOW (ref 39.0–52.0)
Hemoglobin: 10.5 g/dL — ABNORMAL LOW (ref 13.0–17.0)
Immature Granulocytes: 1 %
Lymphocytes Relative: 4 %
Lymphs Abs: 0.7 10*3/uL (ref 0.7–4.0)
MCH: 31.3 pg (ref 26.0–34.0)
MCHC: 32.1 g/dL (ref 30.0–36.0)
MCV: 97.6 fL (ref 80.0–100.0)
Monocytes Absolute: 0.5 10*3/uL (ref 0.1–1.0)
Monocytes Relative: 3 %
Neutro Abs: 17.2 10*3/uL — ABNORMAL HIGH (ref 1.7–7.7)
Neutrophils Relative %: 92 %
Platelets: 296 10*3/uL (ref 150–400)
RBC: 3.35 MIL/uL — ABNORMAL LOW (ref 4.22–5.81)
RDW: 14.1 % (ref 11.5–15.5)
WBC: 18.7 10*3/uL — ABNORMAL HIGH (ref 4.0–10.5)
nRBC: 0 % (ref 0.0–0.2)

## 2019-07-20 LAB — COMPREHENSIVE METABOLIC PANEL
ALT: 45 U/L — ABNORMAL HIGH (ref 0–44)
AST: 95 U/L — ABNORMAL HIGH (ref 15–41)
Albumin: 2.5 g/dL — ABNORMAL LOW (ref 3.5–5.0)
Alkaline Phosphatase: 33 U/L — ABNORMAL LOW (ref 38–126)
Anion gap: 9 (ref 5–15)
BUN: 41 mg/dL — ABNORMAL HIGH (ref 8–23)
CO2: 23 mmol/L (ref 22–32)
Calcium: 8.6 mg/dL — ABNORMAL LOW (ref 8.9–10.3)
Chloride: 109 mmol/L (ref 98–111)
Creatinine, Ser: 1.38 mg/dL — ABNORMAL HIGH (ref 0.61–1.24)
GFR calc Af Amer: 51 mL/min — ABNORMAL LOW (ref >60)
GFR calc non Af Amer: 44 mL/min — ABNORMAL LOW (ref >60)
Glucose, Bld: 142 mg/dL — ABNORMAL HIGH (ref 70–99)
Potassium: 4.6 mmol/L (ref 3.5–5.1)
Sodium: 141 mmol/L (ref 135–145)
Total Bilirubin: 0.8 mg/dL (ref 0.3–1.2)
Total Protein: 6.1 g/dL — ABNORMAL LOW (ref 6.5–8.1)

## 2019-07-20 LAB — GLUCOSE, CAPILLARY
Glucose-Capillary: 125 mg/dL — ABNORMAL HIGH (ref 70–99)
Glucose-Capillary: 152 mg/dL — ABNORMAL HIGH (ref 70–99)
Glucose-Capillary: 199 mg/dL — ABNORMAL HIGH (ref 70–99)
Glucose-Capillary: 176 mg/dL — ABNORMAL HIGH (ref 70–99)
Glucose-Capillary: 185 mg/dL — ABNORMAL HIGH (ref 70–99)

## 2019-07-20 LAB — PHOSPHORUS: Phosphorus: 4.1 mg/dL (ref 2.5–4.6)

## 2019-07-20 LAB — FIBRIN DERIVATIVES D-DIMER (ARMC ONLY): Fibrin derivatives D-dimer (ARMC): 2255.98 ng/mL (FEU) — ABNORMAL HIGH (ref 0.00–499.00)

## 2019-07-20 LAB — FERRITIN: Ferritin: 1949 ng/mL — ABNORMAL HIGH (ref 24–336)

## 2019-07-20 LAB — C-REACTIVE PROTEIN: CRP: 19.6 mg/dL — ABNORMAL HIGH (ref <1.0)

## 2019-07-20 LAB — MAGNESIUM: Magnesium: 2.2 mg/dL (ref 1.7–2.4)

## 2019-07-20 MED ORDER — ASCORBIC ACID 500 MG PO TABS
500.0000 mg | ORAL_TABLET | Freq: Two times a day (BID) | ORAL | Status: DC
  Administered 2019-07-21 – 2019-07-22 (×3): 500 mg
  Filled 2019-07-20 (×2): qty 1

## 2019-07-20 MED ORDER — SENNOSIDES-DOCUSATE SODIUM 8.6-50 MG PO TABS
2.0000 | ORAL_TABLET | Freq: Two times a day (BID) | ORAL | Status: DC
  Administered 2019-07-20 – 2019-07-22 (×5): 2
  Filled 2019-07-20 (×5): qty 2

## 2019-07-20 MED ORDER — ENOXAPARIN SODIUM 80 MG/0.8ML ~~LOC~~ SOLN
1.0000 mg/kg | Freq: Two times a day (BID) | SUBCUTANEOUS | Status: DC
  Administered 2019-07-20 – 2019-07-22 (×4): 75 mg via SUBCUTANEOUS
  Filled 2019-07-20 (×4): qty 0.8

## 2019-07-20 MED ORDER — SODIUM CHLORIDE 0.9 % IV SOLN
3.0000 g | Freq: Four times a day (QID) | INTRAVENOUS | Status: DC
  Administered 2019-07-20 – 2019-07-22 (×8): 3 g via INTRAVENOUS
  Filled 2019-07-20 (×2): qty 8
  Filled 2019-07-20 (×2): qty 3
  Filled 2019-07-20: qty 8
  Filled 2019-07-20 (×3): qty 3
  Filled 2019-07-20: qty 8
  Filled 2019-07-20: qty 3
  Filled 2019-07-20 (×2): qty 8

## 2019-07-20 NOTE — Progress Notes (Signed)
PHARMACY NOTE:  ANTIMICROBIAL RENAL DOSAGE ADJUSTMENT  Current antimicrobial regimen includes a mismatch between antimicrobial dosage and estimated renal function.  As per policy approved by the Pharmacy & Therapeutics and Medical Executive Committees, the antimicrobial dosage will be adjusted accordingly.  Current antimicrobial dosage:  Unasyn 3g IV q12h  Indication: aspiration  Renal Function:  Estimated Creatinine Clearance: 37.1 mL/min (A) (by C-G formula based on SCr of 1.38 mg/dL (H)).    Antimicrobial dosage has been changed to:  Unasyn 3g IV q6h  Pharmacy will continue to monitor and adjust per consult.   Mazen Marcin L, [facility] 07/20/2019 4:35 PM

## 2019-07-20 NOTE — Consult Note (Signed)
[place] for Enoxaparin Indication: atrial fibrillation   Allergies  Allergen Reactions  . Eggs Or Egg-Derived Products Itching    Digestive issues and itching ONLY, per daughter  . Lisinopril     Cough  . Atorvastatin     Pain to walk, stiffness in joints  . Fenofibrate     Leg Pain  . Niacin     Hot flashes  . Peanut-Containing Drug Products Itching    Eating a large of peanuts continuously. Same reaction with other tree nuts.  . Simvastatin     Pain to walk and joint stiffness in legs  . Soy Allergy Other (See Comments)    Digestive issues  . Wheat Bran     Digestive issues  . Aspirin Nausea Only     GI Upset, in high doses in uncoated aspirin   . Nitrofuran Derivatives     GI UPSET    Patient Measurements: Height: 6' (182.9 cm) Weight: 169 lb 5 oz (76.8 kg) IBW/kg (Calculated) : 77.6 Heparin Dosing Weight: 68.4kg  Vital Signs: Temp: 98.4 F (36.9 C) (01/12 0800) Temp Source: Bladder (01/12 0800) BP: 116/53 (01/12 0800) Pulse Rate: 54 (01/12 0800)  Labs: Recent Labs    08/07/2019 1755 07/19/2019 2007 07/18/19 0319 07/18/19 [id number] 07/18/19 1219 07/18/19 2350 07/19/19 0333 07/19/19 0641 07/19/19 1245 07/20/19 0539  HGB 11.9*  --   --  10.8*  --   --  10.9*  --   --  10.5*  HCT 36.3*  --   --  32.5*  --   --  33.5*  --   --  32.7*  PLT 202  --   --  169  --   --  196  --   --  296  APTT  --  48*  --   --   --  95*  --   --   --   --   LABPROT  --  13.3  --   --   --  14.0  --   --   --   --   INR  --  1.0  --   --   --  1.1  --   --   --   --   HEPARINUNFRC  --  <0.10* 0.41  --  0.44  --   --   --  0.20*  --   CREATININE 1.32*  --   --  1.35*  --  1.82*  --   --   --  1.38*  TROPONINIHS 611*  --   --   --   --  3,030*  --  4,729*  --   --     Estimated Creatinine Clearance: 37.1 mL/min (A) (by C-G formula based on SCr of 1.38 mg/dL (H)).   Medical History: Past Medical History:  Diagnosis Date  . Allergic  rhinitis 08/04/2014  . Aortic heart valve narrowing 11/05/2011   Overview:  a.  03/2010:  moderate.  33 mmHg peak grad 24 mmHg mean grad 1.3 cm2    . Arthritis or polyarthritis, rheumatoid ([place]) 11/05/2011   wrist, hands, hips, knees  . Atrial flutter ([place]) 04/16/2013   Overview:  10/14 hosp, felt to be secondary to choking episode.  TEE and DC CV.   . Bacterial urinary infection 11/05/2011   Overview:  Urinary tract infections/prostatitis.  Followed by a urologist in [place], [place].   Marland Kitchen Benign fibroma of prostate 11/05/2011   Overview:  a. Status post prostatic biopsy x 2 which have been negative.   . BP (high blood pressure) 11/05/2011  . CAD in native artery 08/04/2014   Overview:  a. Status post PCI of RCA x 2.  b. 4/00 Positive Myoview. Catheterization revealed EF 70% with 3-vessel disease.  c. 11/01/98 CABG x 3 (by Dr. Ronnald Ramp), LIMA to LAD, SVG to RCA, SVG to ramus.  d. 7/00 catheterization, 100% LIMA, patent vein grafts.  e. 7/00 & 9/00 PCI of LAD.  f. 1/01 Redo CABG (by Dr. Corinna Capra), RIMA to distal LAD, SVG to D1, EF 65% at time of cardiac catheterization.     . Chronic otitis externa 11/05/2011  . Compressed spine fracture ([place]) 11/05/2011   Overview:  11/97 Lumbar collapsed vertebrae with impingement.    . Deafness, sensorineural 12/30/2012  . GERD (gastroesophageal reflux disease)   . Headache    enviornmental allergies  . Hearing aid worn    bilateral  . HLD (hyperlipidemia) 11/05/2011   Overview:  a. 2/07 Lipid profile:  TC 239, TG 232, HDL 35, LDL 158.  b. Many intolerances to Lipid medications.   Marland Kitchen HOH (hard of hearing)   . Mitral stenosis 12/28/2013   Overview:  Moderate per ECHO 10/14 with mean grad 4 and peak grad 70mmHg ALSO mention of mid LV gradient of 63mmHG   . Myocardial infarction [facility]) 1995 & 2017  . Prostate cancer ([place])     Medications:  PTA Eliquis 2.5mg  bid - Last dose appears to be PTA.  Admitted 1/9.     Assessment: 86 y.o. male who is COVID positive with hx of atrial  fibrillation on apixaban, s/p TAVR, CAD, extremely hard of hearing, prostate cancer who presents for decreased responsiveness, confusion.  Patient was switched from heparin to enoxaparin to minimize lab draws in the setting of COVID-19.  Anticoagulation had been held on 1/10 due to coughing episode with bloody sputum.  Hemoglobin stable.  Platelets are currently increasing.  Goal of Therapy:  Monitor platelets by anticoagulation protocol: Yes   Plan:  Currently receiving enoxaparin 1 mg/kg once daily.  As serum creatinine is improving, will adjust to enoxaparin 1 mg/kg twice daily (CrCl >30 mL/min).   Will obtain CMP with AM labs, and follow CBC.  Gerald Dexter, PharmD 07/20/2019 11:10 AM

## 2019-07-20 NOTE — Progress Notes (Signed)
CRITICAL CARE NOTE  86 yo WM with severe hypoxic resp failure from COVID 19 infection/pneumonia    CC  follow up respiratory failure  SUBJECTIVE Patient remains critically ill Prognosis is guarded   BP (!) 113/49   Pulse (!) 52   Temp 98.6 F (37 C)   Resp 18   Ht 6' (1.829 m)   Wt 76.8 kg   SpO2 99%   BMI 22.96 kg/m    I/O last 3 completed shifts: In: 2887.7 [I.V.:1363.4; NG/GT:379.5; IV Piggyback:1144.9] Out: [id number] [Urine:1375] No intake/output data recorded.  SpO2: 99 % FiO2 (%): 50 %  Vent Mode: PRVC FiO2 (%):  [40 %-50 %] 50 % Set Rate:  [18 bmp] 18 bmp Vt Set:  [500 mL] 500 mL PEEP:  [5 cmH20] [place] Pressure:  [19 cmH20-22 cmH20] 22 cmH20   SIGNIFICANT EVENTS/STUDIES:  01/9: Pt presented to [facility] ER with worsening weakness and altered mental status dx with COVID-19, atrial fibrillation with rvr, acute metabolic encephalopathy, and NSTEMI vs. demand ischemia requiring 2L O2 via nasal canula. Pt admitted to telemetry unit but remained in [facility] ER pending bed availability  01/9: CT Head revealed no acute intracranial process 01/10: Cardiology consulted recommended continuing heparin gtt although felt symptoms not consistent with acute coronary syndrome, pt deemed not a candidate for invasive cardiac catheterization  01/10: Pt developed worsening respiratory failure suspected secondary to aspiration following coughing episode with bloody sputum production followed by an episode of emesis requiring mechanical intubation.  PCCM contacted by hospitalist team to assume care   1/11 self extubated, reintubated, severe hypoxia  REVIEW OF SYSTEMS  PATIENT IS UNABLE TO PROVIDE COMPLETE REVIEW OF SYSTEMS DUE TO SEVERE CRITICAL ILLNESS   COVID-19 DISASTER DECLARATION:   FULL CONTACT PHYSICAL EXAMINATION WAS NOT POSSIBLE DUE TO TREATMENT OF COVID-19 AND   CONSERVATION OF PERSONAL PROTECTIVE EQUIPMENT, LIMITED EXAM FINDINGS INCLUDE-   Patient assessed or the  symptoms described in the history of present illness.   In the context of the Global COVID-19 pandemic, which necessitated consideration that the patient might be at risk for infection with the SARS-CoV-2 virus that causes COVID-19, Institutional protocols and algorithms that pertain to the evaluation of patients at risk for COVID-19 are in a state of rapid change based on information released by regulatory bodies including the CDC and federal and state organizations. These policies and algorithms were followed during the patient's care while in hospital.     MEDICATIONS: I have reviewed all medications and confirmed regimen as documented   CULTURE RESULTS   Recent Results (from the past 240 hour(s))  Urine culture     Status: Abnormal   Collection Time: 07/09/2019  5:55 PM   Specimen: Urine, Clean Catch  Result Value Ref Range Status   Specimen Description   Final    URINE, CLEAN CATCH Performed at [facility], [address], [place], [place] [address]    Special Requests   Final    NONE Performed at [facility], [address] [place], [place] [address]    Culture MULTIPLE SPECIES PRESENT, [place] (A)  Final   Report Status 07/19/2019 FINAL  Final  Culture, blood (routine x 2)     Status: None (Preliminary result)   Collection Time: 07/16/2019  5:55 PM   Specimen: BLOOD  Result Value Ref Range Status   Specimen Description BLOOD BLOOD RIGHT FOREARM  Final   Special Requests   Final    BOTTLES DRAWN AEROBIC AND  ANAEROBIC Blood Culture adequate volume   Culture   Final    NO GROWTH 3 DAYS Performed at [facility], [address] [place], [place] [address]    Report Status PENDING  Incomplete  Culture, blood (routine x 2)     Status: None (Preliminary result)   Collection Time: 07/12/2019  8:07 PM   Specimen: BLOOD  Result Value Ref Range Status   Specimen Description BLOOD BLOOD LEFT HAND  Final   Special Requests   Final     BOTTLES DRAWN AEROBIC AND ANAEROBIC Blood Culture adequate volume   Culture   Final    NO GROWTH 3 DAYS Performed at [facility], [place], [place] [address]    Report Status PENDING  Incomplete  SARS CORONAVIRUS 2 (TAT 6-24 HRS) Nasopharyngeal Nasopharyngeal Swab     Status: Abnormal   Collection Time: 07/16/2019  8:07 PM   Specimen: Nasopharyngeal Swab  Result Value Ref Range Status   SARS Coronavirus 2 POSITIVE (A) NEGATIVE Final    Comment: RESULT CALLED TO, READ BACK BY AND VERIFIED WITH: DAVIS B, RN 0719 ON 07/18/2019 BY SAINVILUS S (NOTE) SARS-CoV-2 target nucleic acids are DETECTED. The SARS-CoV-2 RNA is generally detectable in upper and lower respiratory specimens during the acute phase of infection. Positive results are indicative of the presence of SARS-CoV-2 RNA. Clinical correlation with patient history and other diagnostic information is  necessary to determine patient infection status. Positive results do not rule out bacterial infection or co-infection with other viruses.  The expected result is Negative. Fact Sheet for Patients: SugarRoll.be Fact Sheet for Healthcare Providers: https://www.woods-mathews.com/ This test is not yet approved or cleared by the [place] FDA and  has been authorized for detection and/or diagnosis of SARS-CoV-2 by FDA under an Emergency Use Authorization (EUA). This EUA will remain  in effect (meaning this test can be use d) for the duration of the COVID-19 declaration under Section 564(b)(1) of the Act, 21 U.S.C. section 360bbb-3(b)(1), unless the authorization is terminated or revoked sooner. Performed at [facility], [place] [address] [place], [place] [address]   Culture, respiratory (non-expectorated)     Status: None (Preliminary result)   Collection Time: 07/19/19 12:14 AM   Specimen: Tracheal Aspirate; Respiratory  Result Value Ref Range Status   Specimen  Description   Final    TRACHEAL ASPIRATE Performed at [facility], [address], [place], [place] [address]    Special Requests   Final    NONE Performed at [facility], [address] [place], [place] [address]    Gram Stain   Final    RARE WBC PRESENT,BOTH PMN AND MONONUCLEAR NO ORGANISMS SEEN Performed at [facility], [place] [address] [place], [place] [address]    Culture PENDING  Incomplete   Report Status PENDING  Incomplete  MRSA PCR Screening     Status: None   Collection Time: 07/19/19 12:14 AM   Specimen: Nasal Mucosa; Nasopharyngeal  Result Value Ref Range Status   MRSA by PCR NEGATIVE NEGATIVE Final    Comment:        The GeneXpert MRSA Assay (FDA approved for NASAL specimens only), is one component of a comprehensive MRSA colonization surveillance program. It is not intended to diagnose MRSA infection nor to guide or monitor treatment for MRSA infections. Performed at [facility], [address] [place], [place] [address]   Respiratory Panel by PCR  Status: None   Collection Time: 07/19/19 12:38 AM   Specimen: Nasopharyngeal Swab; Respiratory  Result Value Ref Range Status   Adenovirus NOT DETECTED NOT DETECTED Final   Coronavirus 229E NOT DETECTED NOT DETECTED Final    Comment: (NOTE) The Coronavirus on the Respiratory Panel, DOES NOT test for the novel  Coronavirus (2019 nCoV)    Coronavirus HKU1 NOT DETECTED NOT DETECTED Final   Coronavirus NL63 NOT DETECTED NOT DETECTED Final   Coronavirus OC43 NOT DETECTED NOT DETECTED Final   Metapneumovirus NOT DETECTED NOT DETECTED Final   Rhinovirus / Enterovirus NOT DETECTED NOT DETECTED Final   Influenza A NOT DETECTED NOT DETECTED Final   Influenza B NOT DETECTED NOT DETECTED Final   Parainfluenza Virus 1 NOT DETECTED NOT DETECTED Final   Parainfluenza Virus 2 NOT DETECTED NOT DETECTED Final   Parainfluenza Virus 3 NOT DETECTED NOT DETECTED Final   Parainfluenza  Virus 4 NOT DETECTED NOT DETECTED Final   Respiratory Syncytial Virus NOT DETECTED NOT DETECTED Final   Bordetella pertussis NOT DETECTED NOT DETECTED Final   Chlamydophila pneumoniae NOT DETECTED NOT DETECTED Final   Mycoplasma pneumoniae NOT DETECTED NOT DETECTED Final    Comment: Performed at [facility], [place]. [address], [place], [place] [address]          IMAGING    DG Chest Port 1 View  Result Date: 07/19/2019 CLINICAL DATA:  Endotracheal tube intubation EXAM: PORTABLE CHEST 1 VIEW COMPARISON:  07/18/2019 FINDINGS: Endotracheal tube in good position.  NG tube in the stomach. Bilateral airspace disease unchanged.  No pneumothorax or effusion. IMPRESSION: Endotracheal tube in good position. Bilateral airspace disease unchanged. Electronically Signed   By: Franchot Gallo M.D.   On: 07/19/2019 16:07   ECHOCARDIOGRAM COMPLETE  Result Date: 07/19/2019   ECHOCARDIOGRAM REPORT   Patient Name:   MCLANE THEODOROU Date of Exam: 07/19/2019 Medical Rec #:  [id number]      Height:       72.0 in Accession #:    [id number]     Weight:       169.5 lb Date of Birth:  [birth year]     BSA:          1.99 m Patient Age:    78 years       BP:           103/59 mmHg Patient Gender: M              HR:           104 bpm. Exam Location:  [facility] Procedure: 2D Echo, Color Doppler and Cardiac Doppler Indications:     I21.4 NSTEMI  History:         Patient has prior history of Echocardiogram examinations.                  HFpEF, Previous Myocardial Infarction and CAD, Prior CABG; Risk                  Factors:Dyslipidemia. Aortic Valve: A CoreValve-EvolutR                  bioprosthetic, stented aortic valve (TAVR) TAVR; Pt tested                  positive for novel coronavirus.  Sonographer:     Charmayne Sheer RDCS (AE) Referring Phys:  [id number] [facility] AMERY Diagnosing Phys: Isaias Cowman MD  Sonographer Comments: Echo performed with patient supine and on artificial  respirator, suboptimal parasternal window and no  subcostal window. IMPRESSIONS  1. Left ventricular ejection fraction, by visual estimation, is 40 to 45%. The left ventricle has normal function. There is no left ventricular hypertrophy.  2. The left ventricle demonstrates regional wall motion abnormalities.  3. Global right ventricle has normal systolic function.The right ventricular size is normal. No increase in right ventricular wall thickness.  4. Left atrial size was normal.  5. Right atrial size was normal.  6. The mitral valve is normal in structure. Moderate mitral valve regurgitation. No evidence of mitral stenosis.  7. The tricuspid valve is normal in structure.  8. The aortic valve is abnormal. Aortic valve regurgitation is not visualized. No evidence of aortic valve sclerosis or stenosis.  9. The pulmonic valve was normal in structure. Pulmonic valve regurgitation is not visualized. 10. The inferior vena cava is normal in size with greater than 50% respiratory variability, suggesting right atrial pressure of 3 mmHg. FINDINGS  Left Ventricle: Left ventricular ejection fraction, by visual estimation, is 40 to 45%. The left ventricle has normal function. The left ventricle demonstrates regional wall motion abnormalities. There is no left ventricular hypertrophy. Normal left atrial pressure. Right Ventricle: The right ventricular size is normal. No increase in right ventricular wall thickness. Global RV systolic function is has normal systolic function. Left Atrium: Left atrial size was normal in size. Right Atrium: Right atrial size was normal in size Pericardium: There is no evidence of pericardial effusion. Mitral Valve: The mitral valve is normal in structure. Moderate mitral valve regurgitation. No evidence of mitral valve stenosis by observation. MV peak gradient, 9.6 mmHg. Tricuspid Valve: The tricuspid valve is normal in structure. Tricuspid valve regurgitation mild-moderate. Aortic Valve: The aortic valve is abnormal. Aortic valve regurgitation is  not visualized. The aortic valve is structurally normal, with no evidence of sclerosis or stenosis. Aortic valve mean gradient measures 3.0 mmHg. Aortic valve peak gradient measures 4.9 mmHg. Aortic valve area, by VTI measures 1.90 cm. CoreValve-EvolutR bioprosthetic, stented aortic valve (TAVR) valve is present in the aortic position. Pulmonic Valve: The pulmonic valve was normal in structure. Pulmonic valve regurgitation is not visualized. Pulmonic regurgitation is not visualized. Aorta: The aortic root, ascending aorta and aortic arch are all structurally normal, with no evidence of dilitation or obstruction. Venous: The inferior vena cava is normal in size with greater than 50% respiratory variability, suggesting right atrial pressure of 3 mmHg. IAS/Shunts: No atrial level shunt detected by color flow Doppler. There is no evidence of a patent foramen ovale. No ventricular septal defect is seen or detected. There is no evidence of an atrial septal defect.  LEFT VENTRICLE PLAX 2D LVIDd:         3.61 cm  Diastology LVIDs:         2.79 cm  LV e' lateral:   5.33 cm/s LV PW:         1.06 cm  LV E/e' lateral: 19.0 LV IVS:        0.93 cm  LV e' medial:    4.03 cm/s LVOT diam:     1.80 cm  LV E/e' medial:  25.1 LV SV:         26 ml LV SV Index:   12.90 LVOT Area:     2.54 cm  LEFT ATRIUM             Index       RIGHT ATRIUM  Index LA diam:        3.00 cm 1.51 cm/m  RA Area:     6.54 cm LA Vol (A2C):   19.2 ml 9.67 ml/m  RA Volume:   9.08 ml  4.57 ml/m LA Vol (A4C):   26.2 ml 13.19 ml/m LA Biplane Vol: 24.2 ml 12.18 ml/m  AORTIC VALVE                   PULMONIC VALVE AV Area (Vmax):    2.10 cm    PV Vmax:       1.26 m/s AV Area (Vmean):   1.83 cm    PV Vmean:      85.200 cm/s AV Area (VTI):     1.90 cm    PV VTI:        0.152 m AV Vmax:           111.00 cm/s PV Peak grad:  6.4 mmHg AV Vmean:          76.500 cm/s PV Mean grad:  3.0 mmHg AV VTI:            0.161 m AV Peak Grad:      4.9 mmHg AV Mean Grad:       3.0 mmHg LVOT Vmax:         91.40 cm/s LVOT Vmean:        55.000 cm/s LVOT VTI:          0.120 m LVOT/AV VTI ratio: 0.75  AORTA Ao Root diam: 2.70 cm MITRAL VALVE MV Area (PHT): 6.52 cm              SHUNTS MV Peak grad:  9.6 mmHg              Systemic VTI:  0.12 m MV Mean grad:  4.0 mmHg              Systemic Diam: 1.80 cm MV Vmax:       1.55 m/s MV Vmean:      91.8 cm/s MV VTI:        0.18 m MV PHT:        33.74 msec MV Decel Time: 116 msec MV E velocity: 101.03 cm/s 103 cm/s MV A velocity: 162.33 cm/s 70.3 cm/s MV E/A ratio:  0.62        1.5  Isaias Cowman MD Electronically signed by Isaias Cowman MD Signature Date/Time: 07/19/2019/12:53:11 PM    Final     CBC    Component Value Date/Time   WBC 18.7 (H) 07/20/2019 0539   RBC 3.35 (L) 07/20/2019 0539   HGB 10.5 (L) 07/20/2019 0539   HCT 32.7 (L) 07/20/2019 0539   PLT 296 07/20/2019 0539   MCV 97.6 07/20/2019 0539   MCH 31.3 07/20/2019 0539   MCHC 32.1 07/20/2019 0539   RDW 14.1 07/20/2019 0539   LYMPHSABS 0.7 07/20/2019 0539   MONOABS 0.5 07/20/2019 0539   EOSABS 0.0 07/20/2019 0539   BASOSABS 0.0 07/20/2019 0539   BMP Latest Ref Rng & Units 07/18/2019 07/18/2019 08/05/2019  Glucose 70 - 99 mg/dL 182(H) 110(H) 130(H)  BUN 8 - 23 mg/dL 32(H) 23 26(H)  Creatinine 0.61 - 1.24 mg/dL 1.82(H) 1.35(H) 1.32(H)  BUN/Creat Ratio 10 - 24 - - -  Sodium 135 - 145 mmol/L 137 138 139  Potassium 3.5 - 5.1 mmol/L 4.1 3.6 3.8  Chloride 98 - 111 mmol/L 105 105 104  CO2 22 - 32 mmol/L 21(L) 20(L)  21(L)  Calcium 8.9 - 10.3 mg/dL 8.3(L) 8.8(L) 9.5      Indwelling Urinary Catheter continued, requirement due to   Reason to continue Indwelling Urinary Catheter strict Intake/Output monitoring for hemodynamic instability         Ventilator continued, requirement due to severe respiratory failure   Ventilator Sedation RASS 0 to -2      ASSESSMENT AND PLAN SYNOPSIS   Severe ACUTE Hypoxic and Hypercapnic Respiratory Failure from  COVID 19 infection/pneumonia -continue Full MV support -continue Bronchodilator Therapy -Wean Fio2 and PEEP as tolerated  Severe COVID-19 infection, ARDS and pneumonia/pneumonitis Continue IV steroids  IV remdisivir Continue proning as tolerated due to severe hypoxia   Maintain airborne and contact precautions  As needed bronchodilators (MDI) Vitamin C and zinc Antitussives High risk for intubation and death    ACUTE KIDNEY INJURY/Renal Failure -follow chem 7 -follow UO -continue Foley Catheter-assess need -Avoid nephrotoxic agents -Recheck creatinine    NEUROLOGY - intubated and sedated - minimal sedation to achieve a RASS goal: -1    SHOCK-SEPSIS/HYPOVOLUMIC/CARDIOGENIC -use vasopressors to keep MAP>65 -follow ABG and LA -follow up cultures -emperic ABX -consider stress dose steroids -aggressive IV fluid resuscitation  CARDIAC ICU monitoring  ID -continue IV abx as prescibed -follow up cultures  GI GI PROPHYLAXIS as indicated  NUTRITIONAL STATUS DIET-->TF's as tolerated Constipation protocol as indicated  ENDO - will use ICU hypoglycemic\Hyperglycemia protocol if indicated   ELECTROLYTES -follow labs as needed -replace as needed -pharmacy consultation and following   DVT/GI PRX ordered TRANSFUSIONS AS NEEDED MONITOR FSBS ASSESS the need for LABS as needed   Critical Care Time devoted to patient care services described in this note is 35 minutes.   Overall, patient is critically ill, prognosis is guarded.  Patient with Multiorgan failure and at high risk for cardiac arrest and death.   Patient is DNR  Corrin Parker, M.D.  [facility]  Medical Director [place] Director [facility] Cardio-Pulmonary Department

## 2019-07-21 LAB — COMPREHENSIVE METABOLIC PANEL
ALT: 55 U/L — ABNORMAL HIGH (ref 0–44)
AST: 86 U/L — ABNORMAL HIGH (ref 15–41)
Albumin: 2.6 g/dL — ABNORMAL LOW (ref 3.5–5.0)
Alkaline Phosphatase: 34 U/L — ABNORMAL LOW (ref 38–126)
Anion gap: 8 (ref 5–15)
BUN: 57 mg/dL — ABNORMAL HIGH (ref 8–23)
CO2: 24 mmol/L (ref 22–32)
Calcium: 8.5 mg/dL — ABNORMAL LOW (ref 8.9–10.3)
Chloride: 107 mmol/L (ref 98–111)
Creatinine, Ser: 1.43 mg/dL — ABNORMAL HIGH (ref 0.61–1.24)
GFR calc Af Amer: 49 mL/min — ABNORMAL LOW (ref >60)
GFR calc non Af Amer: 42 mL/min — ABNORMAL LOW (ref >60)
Glucose, Bld: 237 mg/dL — ABNORMAL HIGH (ref 70–99)
Potassium: 4.7 mmol/L (ref 3.5–5.1)
Sodium: 139 mmol/L (ref 135–145)
Total Bilirubin: 0.6 mg/dL (ref 0.3–1.2)
Total Protein: 5.9 g/dL — ABNORMAL LOW (ref 6.5–8.1)

## 2019-07-21 LAB — CULTURE, RESPIRATORY W GRAM STAIN: Culture: NO GROWTH

## 2019-07-21 LAB — CBC WITH DIFFERENTIAL/PLATELET
Abs Immature Granulocytes: 0.2 10*3/uL — ABNORMAL HIGH (ref 0.00–0.07)
Basophils Absolute: 0 10*3/uL (ref 0.0–0.1)
Basophils Relative: 0 %
Eosinophils Absolute: 0 10*3/uL (ref 0.0–0.5)
Eosinophils Relative: 0 %
HCT: 32.3 % — ABNORMAL LOW (ref 39.0–52.0)
Hemoglobin: 10.1 g/dL — ABNORMAL LOW (ref 13.0–17.0)
Immature Granulocytes: 1 %
Lymphocytes Relative: 6 %
Lymphs Abs: 0.8 10*3/uL (ref 0.7–4.0)
MCH: 31.8 pg (ref 26.0–34.0)
MCHC: 31.3 g/dL (ref 30.0–36.0)
MCV: 101.6 fL — ABNORMAL HIGH (ref 80.0–100.0)
Monocytes Absolute: 0.4 10*3/uL (ref 0.1–1.0)
Monocytes Relative: 3 %
Neutro Abs: 12.7 10*3/uL — ABNORMAL HIGH (ref 1.7–7.7)
Neutrophils Relative %: 90 %
Platelets: 270 10*3/uL (ref 150–400)
RBC: 3.18 MIL/uL — ABNORMAL LOW (ref 4.22–5.81)
RDW: 14.4 % (ref 11.5–15.5)
Smear Review: UNDETERMINED
WBC: 14.2 10*3/uL — ABNORMAL HIGH (ref 4.0–10.5)
nRBC: 0 % (ref 0.0–0.2)

## 2019-07-21 LAB — GLUCOSE, CAPILLARY
Glucose-Capillary: 203 mg/dL — ABNORMAL HIGH (ref 70–99)
Glucose-Capillary: 210 mg/dL — ABNORMAL HIGH (ref 70–99)
Glucose-Capillary: 210 mg/dL — ABNORMAL HIGH (ref 70–99)
Glucose-Capillary: 215 mg/dL — ABNORMAL HIGH (ref 70–99)
Glucose-Capillary: 218 mg/dL — ABNORMAL HIGH (ref 70–99)
Glucose-Capillary: 225 mg/dL — ABNORMAL HIGH (ref 70–99)
Glucose-Capillary: 246 mg/dL — ABNORMAL HIGH (ref 70–99)

## 2019-07-21 LAB — FERRITIN: Ferritin: 1340 ng/mL — ABNORMAL HIGH (ref 24–336)

## 2019-07-21 LAB — PHOSPHORUS: Phosphorus: 3.4 mg/dL (ref 2.5–4.6)

## 2019-07-21 LAB — FIBRIN DERIVATIVES D-DIMER (ARMC ONLY): Fibrin derivatives D-dimer (ARMC): 2114.72 ng/mL (FEU) — ABNORMAL HIGH (ref 0.00–499.00)

## 2019-07-21 LAB — C-REACTIVE PROTEIN: CRP: 13.3 mg/dL — ABNORMAL HIGH (ref <1.0)

## 2019-07-21 LAB — MAGNESIUM: Magnesium: 2.3 mg/dL (ref 1.7–2.4)

## 2019-07-21 MED ORDER — MIDODRINE HCL 5 MG PO TABS
10.0000 mg | ORAL_TABLET | Freq: Three times a day (TID) | ORAL | Status: DC
  Administered 2019-07-21 – 2019-07-22 (×4): 10 mg via ORAL
  Filled 2019-07-21 (×4): qty 2

## 2019-07-21 NOTE — Progress Notes (Signed)
Richard Elliott made phone call to Pt.'s son Richard Elliott) and dtr. Richard Elliott) per RN referral.  [place] unable to reach dtr.  Son shared that Pt. was admitted to [facility] after 'acting funny' Thurs. (1/7) and demonstrating continued confusion on Sat. (1/9); pt. initially admitted to ED via ambulance and 'perked up' at visit from dtr.,  but was intubated 'after six hours'.  Son shared that he thought his father's condition was serious, between [place] and heart issues.  Son said his siblings have been in conversation about 'the plan going forward'; shared that his mother (pt.'s wife of 87yrs) died 4 years ago and that pt. 'lost the will to live' 19yrs ago.  In light of this, family is agreed that Pt. 'don't wanna be here'.  Son is awaiting arrival of other son from [facility] to make further decisions.  [facility] offered supportive listening and made son aware of [facility] availability.  No further needs expressed at this time.   07/21/19 1500  Clinical Encounter Type  Visited With Family;Patient not available  Visit Type Initial;Psychological support;Social support  Referral From Nurse  Spiritual Encounters  Spiritual Needs Grief support;Emotional  Stress Factors  Family Stress Factors Major life changes;Health changes

## 2019-07-21 NOTE — Consult Note (Signed)
[place] for Enoxaparin Indication: atrial fibrillation   Allergies  Allergen Reactions  . Eggs Or Egg-Derived Products Itching    Digestive issues and itching ONLY, per daughter  . Lisinopril     Cough  . Atorvastatin     Pain to walk, stiffness in joints  . Fenofibrate     Leg Pain  . Niacin     Hot flashes  . Peanut-Containing Drug Products Itching    Eating a large of peanuts continuously. Same reaction with other tree nuts.  . Simvastatin     Pain to walk and joint stiffness in legs  . Soy Allergy Other (See Comments)    Digestive issues  . Wheat Bran     Digestive issues  . Aspirin Nausea Only     GI Upset, in high doses in uncoated aspirin   . Nitrofuran Derivatives     GI UPSET    Patient Measurements: Height: 6' (182.9 cm) Weight: 169 lb 15.6 oz (77.1 kg) IBW/kg (Calculated) : 77.6 Heparin Dosing Weight: 68.4kg  Vital Signs: Temp: 97.7 F (36.5 C) (01/13 1300) BP: 113/45 (01/13 1300) Pulse Rate: 46 (01/13 1300)  Labs: Recent Labs    07/18/19 2350 07/19/19 0333 07/19/19 0641 07/19/19 1245 07/20/19 0539 07/21/19 0436  HGB  --  10.9*  --   --  10.5* 10.1*  HCT  --  33.5*  --   --  32.7* 32.3*  PLT  --  196  --   --  296 270  APTT 95*  --   --   --   --   --   LABPROT 14.0  --   --   --   --   --   INR 1.1  --   --   --   --   --   HEPARINUNFRC  --   --   --  0.20*  --   --   CREATININE 1.82*  --   --   --  1.38* 1.43*  TROPONINIHS 3,030*  --  4,729*  --   --   --     Estimated Creatinine Clearance: 35.9 mL/min (A) (by C-G formula based on SCr of 1.43 mg/dL (H)).   Medical History: Past Medical History:  Diagnosis Date  . Allergic rhinitis 08/04/2014  . Aortic heart valve narrowing 11/05/2011   Overview:  a.  03/2010:  moderate.  33 mmHg peak grad 24 mmHg mean grad 1.3 cm2    . Arthritis or polyarthritis, rheumatoid ([place]) 11/05/2011   wrist, hands, hips, knees  . Atrial flutter ([place]) 04/16/2013   Overview:  10/14 hosp, felt to be secondary to choking episode.  TEE and DC CV.   . Bacterial urinary infection 11/05/2011   Overview:  Urinary tract infections/prostatitis.  Followed by a urologist in [place], [place].   Marland Kitchen Benign fibroma of prostate 11/05/2011   Overview:  a. Status post prostatic biopsy x 2 which have been negative.   . BP (high blood pressure) 11/05/2011  . CAD in native artery 08/04/2014   Overview:  a. Status post PCI of RCA x 2.  b. 4/00 Positive Myoview. Catheterization revealed EF 70% with 3-vessel disease.  c. 11/01/98 CABG x 3 (by Dr. Ronnald Ramp), LIMA to LAD, SVG to RCA, SVG to ramus.  d. 7/00 catheterization, 100% LIMA, patent vein grafts.  e. 7/00 & 9/00 PCI of LAD.  f. 1/01 Redo CABG (by Dr. Corinna Capra), RIMA to distal LAD, SVG to D1,  EF 65% at time of cardiac catheterization.     . Chronic otitis externa 11/05/2011  . Compressed spine fracture ([place]) 11/05/2011   Overview:  11/97 Lumbar collapsed vertebrae with impingement.    . Deafness, sensorineural 12/30/2012  . GERD (gastroesophageal reflux disease)   . Headache    enviornmental allergies  . Hearing aid worn    bilateral  . HLD (hyperlipidemia) 11/05/2011   Overview:  a. 2/07 Lipid profile:  TC 239, TG 232, HDL 35, LDL 158.  b. Many intolerances to Lipid medications.   Marland Kitchen HOH (hard of hearing)   . Mitral stenosis 12/28/2013   Overview:  Moderate per ECHO 10/14 with mean grad 4 and peak grad 59mmHg ALSO mention of mid LV gradient of 7mmHG   . Myocardial infarction [facility]) 1995 & 2017  . Prostate cancer ([place])     Medications:  PTA Eliquis 2.5mg  bid - Last dose appears to be PTA.  Admitted 1/9.     Assessment: 86 y.o. male who is COVID positive with hx of atrial fibrillation on apixaban, s/p TAVR, CAD, extremely hard of hearing, prostate cancer who presents for decreased responsiveness, confusion.  Patient was switched from heparin to enoxaparin to minimize lab draws in the setting of COVID-19.  Anticoagulation had been held on 1/10  due to coughing episode with bloody sputum.  Hemoglobin stable.    Goal of Therapy:  Monitor platelets by anticoagulation protocol: Yes   Plan:  Continue enoxaparin 1mg /kg Q12hr.   Will obtain serum creatinine and platelets per protocol.   Pharmacy will continue to monitor and adjust per consult.   Richard Elliott L 07/21/2019 3:23 PM

## 2019-07-22 DIAGNOSIS — I4892 Unspecified atrial flutter: Secondary | ICD-10-CM

## 2019-07-22 LAB — CULTURE, BLOOD (ROUTINE X 2)
Culture: NO GROWTH
Culture: NO GROWTH
Special Requests: ADEQUATE
Special Requests: ADEQUATE

## 2019-07-22 LAB — COMPREHENSIVE METABOLIC PANEL
ALT: 50 U/L — ABNORMAL HIGH (ref 0–44)
AST: 51 U/L — ABNORMAL HIGH (ref 15–41)
Albumin: 2.6 g/dL — ABNORMAL LOW (ref 3.5–5.0)
Alkaline Phosphatase: 45 U/L (ref 38–126)
Anion gap: 11 (ref 5–15)
BUN: 71 mg/dL — ABNORMAL HIGH (ref 8–23)
CO2: 21 mmol/L — ABNORMAL LOW (ref 22–32)
Calcium: 8.6 mg/dL — ABNORMAL LOW (ref 8.9–10.3)
Chloride: 111 mmol/L (ref 98–111)
Creatinine, Ser: 1.48 mg/dL — ABNORMAL HIGH (ref 0.61–1.24)
GFR calc Af Amer: 47 mL/min — ABNORMAL LOW (ref >60)
GFR calc non Af Amer: 40 mL/min — ABNORMAL LOW (ref >60)
Glucose, Bld: 251 mg/dL — ABNORMAL HIGH (ref 70–99)
Potassium: 4.7 mmol/L (ref 3.5–5.1)
Sodium: 143 mmol/L (ref 135–145)
Total Bilirubin: 0.8 mg/dL (ref 0.3–1.2)
Total Protein: 6.2 g/dL — ABNORMAL LOW (ref 6.5–8.1)

## 2019-07-22 LAB — CBC WITH DIFFERENTIAL/PLATELET
Abs Immature Granulocytes: 1.16 10*3/uL — ABNORMAL HIGH (ref 0.00–0.07)
Basophils Absolute: 0.1 10*3/uL (ref 0.0–0.1)
Basophils Relative: 0 %
Eosinophils Absolute: 0 10*3/uL (ref 0.0–0.5)
Eosinophils Relative: 0 %
HCT: 34.9 % — ABNORMAL LOW (ref 39.0–52.0)
Hemoglobin: 11 g/dL — ABNORMAL LOW (ref 13.0–17.0)
Immature Granulocytes: 5 %
Lymphocytes Relative: 6 %
Lymphs Abs: 1.4 10*3/uL (ref 0.7–4.0)
MCH: 31.7 pg (ref 26.0–34.0)
MCHC: 31.5 g/dL (ref 30.0–36.0)
MCV: 100.6 fL — ABNORMAL HIGH (ref 80.0–100.0)
Monocytes Absolute: 1 10*3/uL (ref 0.1–1.0)
Monocytes Relative: 4 %
Neutro Abs: 18.6 10*3/uL — ABNORMAL HIGH (ref 1.7–7.7)
Neutrophils Relative %: 85 %
Platelets: 391 10*3/uL (ref 150–400)
RBC: 3.47 MIL/uL — ABNORMAL LOW (ref 4.22–5.81)
RDW: 14.6 % (ref 11.5–15.5)
Smear Review: NORMAL
WBC: 22.2 10*3/uL — ABNORMAL HIGH (ref 4.0–10.5)
nRBC: 0.5 % — ABNORMAL HIGH (ref 0.0–0.2)

## 2019-07-22 LAB — GLUCOSE, CAPILLARY
Glucose-Capillary: 238 mg/dL — ABNORMAL HIGH (ref 70–99)
Glucose-Capillary: 270 mg/dL — ABNORMAL HIGH (ref 70–99)
Glucose-Capillary: 255 mg/dL — ABNORMAL HIGH (ref 70–99)

## 2019-07-22 LAB — PHOSPHORUS: Phosphorus: 3.9 mg/dL (ref 2.5–4.6)

## 2019-07-22 LAB — MAGNESIUM: Magnesium: 2.5 mg/dL — ABNORMAL HIGH (ref 1.7–2.4)

## 2019-07-22 LAB — FERRITIN: Ferritin: 956 ng/mL — ABNORMAL HIGH (ref 24–336)

## 2019-07-22 LAB — FIBRIN DERIVATIVES D-DIMER (ARMC ONLY): Fibrin derivatives D-dimer (ARMC): 2431.14 ng/mL (FEU) — ABNORMAL HIGH (ref 0.00–499.00)

## 2019-07-22 LAB — C-REACTIVE PROTEIN: CRP: 9 mg/dL — ABNORMAL HIGH (ref <1.0)

## 2019-07-22 MED ORDER — POLYVINYL ALCOHOL 1.4 % OP SOLN
1.0000 [drp] | Freq: Four times a day (QID) | OPHTHALMIC | Status: DC | PRN
  Filled 2019-07-22: qty 15

## 2019-07-22 MED ORDER — GLYCOPYRROLATE 1 MG PO TABS
1.0000 mg | ORAL_TABLET | ORAL | Status: DC | PRN
  Filled 2019-07-22: qty 1

## 2019-07-22 MED ORDER — MORPHINE 100MG IN NS 100ML (1MG/ML) PREMIX INFUSION: 0.0000 mg/h | INTRAVENOUS | Status: DC

## 2019-07-22 MED ORDER — GLYCOPYRROLATE 0.2 MG/ML IJ SOLN: 0.2000 mg | INTRAMUSCULAR | Status: DC | PRN

## 2019-07-22 MED ORDER — DIPHENHYDRAMINE HCL 50 MG/ML IJ SOLN: 25.0000 mg | INTRAMUSCULAR | Status: DC | PRN

## 2019-07-22 MED ORDER — GLYCOPYRROLATE 0.2 MG/ML IJ SOLN
0.2000 mg | INTRAMUSCULAR | Status: DC | PRN
  Administered 2019-07-22: 0.2 mg via INTRAVENOUS

## 2019-07-22 MED ORDER — MORPHINE SULFATE (PF) 2 MG/ML IV SOLN: 2.0000 mg | INTRAVENOUS | Status: DC | PRN

## 2019-07-22 MED ORDER — MORPHINE SULFATE (PF) 2 MG/ML IV SOLN
INTRAVENOUS | Status: AC
  Administered 2019-07-22: 2 mg via INTRAVENOUS
  Filled 2019-07-22: qty 1

## 2019-07-22 MED ORDER — DEXTROSE 5 % IV SOLN: INTRAVENOUS | Status: DC

## 2019-07-22 MED ORDER — LORAZEPAM 2 MG/ML IJ SOLN
2.0000 mg | INTRAMUSCULAR | Status: DC | PRN
  Administered 2019-07-22 (×2): 2 mg via INTRAVENOUS

## 2019-07-22 MED ORDER — MORPHINE 100MG IN NS 100ML (1MG/ML) PREMIX INFUSION
1.0000 mg/h | INTRAVENOUS | Status: DC
  Administered 2019-07-22: 5 mg/h via INTRAVENOUS
  Filled 2019-07-22 (×2): qty 100

## 2019-07-22 MED ORDER — LORAZEPAM 2 MG/ML IJ SOLN
INTRAMUSCULAR | Status: AC
  Filled 2019-07-22: qty 1

## 2019-07-22 MED ORDER — MIDAZOLAM HCL 2 MG/2ML IJ SOLN: 2.0000 mg | INTRAMUSCULAR | Status: DC | PRN

## 2019-07-22 MED ORDER — ACETAMINOPHEN 650 MG RE SUPP: 650.0000 mg | Freq: Four times a day (QID) | RECTAL | Status: DC | PRN

## 2019-07-22 MED ORDER — LORAZEPAM 2 MG/ML IJ SOLN
2.0000 mg | INTRAMUSCULAR | Status: DC | PRN
  Administered 2019-07-22: 2 mg via INTRAVENOUS

## 2019-07-22 MED ORDER — MORPHINE BOLUS VIA INFUSION
5.0000 mg | INTRAVENOUS | Status: DC | PRN
  Filled 2019-07-22: qty 5

## 2019-07-22 MED ORDER — ACETAMINOPHEN 325 MG PO TABS: 650.0000 mg | ORAL_TABLET | Freq: Four times a day (QID) | ORAL | Status: DC | PRN

## 2019-08-09 NOTE — Progress Notes (Signed)
CRITICAL CARE NOTE  86 yo WM with severe hypoxic resp failure from COVID 19 infection/pneumonia    CC  follow up respiratory failure  SUBJECTIVE Patient remains critically ill Prognosis is guarded Severe sCHF Severe resp failure   BP (!) 113/50   Pulse 63   Temp 99.5 F (37.5 C)   Resp 18   Ht 6' (1.829 m)   Wt 76.6 kg   SpO2 99%   BMI 22.90 kg/m    I/O last 3 completed shifts: In: 3072.6 [I.V.:1719.4; NG/GT:853.1; IV Piggyback:500] Out: 800 [Urine:800] No intake/output data recorded.  SpO2: 99 % FiO2 (%): (S) 40 %   SIGNIFICANT EVENTS 01/9: Pt presented to [facility] ER with worsening weakness and altered mental status dx with COVID-19, atrial fibrillation with rvr, acute metabolic encephalopathy, and NSTEMIvs. demand ischemiarequiring 2L O2 via nasal canula. Pt admitted to telemetry unit but remained in [facility] ER pending bed availability  01/9: CT Head revealed no acute intracranial process 01/10: Cardiologyconsultedrecommended continuing heparin gttalthough felt symptoms not consistent with acute coronary syndrome, pt deemed not a candidate for invasive cardiac catheterization 01/10: Pt developed worsening respiratory failuresuspected secondary to aspiration following coughing episode with bloody sputum production followed by an episode of emesisrequiringmechanical intubation. PCCM contacted by hospitalist team to assume care  1/11 self extubated, reintubated, severe hypoxia 1/12 family updated 1/13 family has agreed for one way extubation  REVIEW OF SYSTEMS  PATIENT IS UNABLE TO PROVIDE COMPLETE REVIEW OF SYSTEMS DUE TO SEVERE CRITICAL ILLNESS   PHYSICAL EXAMINATION:  GENERAL:critically ill appearing, +resp distress HEAD: Normocephalic, atraumatic.  EYES: Pupils equal, round, reactive to light.  No scleral icterus.  MOUTH: Moist mucosal membrane. NECK: Supple.  PULMONARY: +rhonchi, +wheezing CARDIOVASCULAR: S1 and S2. Regular rate and rhythm. No  murmurs, rubs, or gallops.  GASTROINTESTINAL: Soft, nontender, -distended.  Positive bowel sounds.   MUSCULOSKELETAL: No swelling, clubbing, or edema.  NEUROLOGIC: obtunded, GCS<8 SKIN:intact,warm,dry  MEDICATIONS: I have reviewed all medications and confirmed regimen as documented   CULTURE RESULTS   Recent Results (from the past 240 hour(s))  Urine culture     Status: Abnormal   Collection Time: 07/20/2019  5:55 PM   Specimen: Urine, Clean Catch  Result Value Ref Range Status   Specimen Description   Final    URINE, CLEAN CATCH Performed at [facility], [address], [place], [place] [address]    Special Requests   Final    NONE Performed at [facility], [address] [place], [place] [address]    Culture MULTIPLE SPECIES PRESENT, SUGGEST RECOLLECTION (A)  Final   Report Status 07/19/2019 FINAL  Final  Culture, blood (routine x 2)     Status: None   Collection Time: 07/25/2019  5:55 PM   Specimen: BLOOD  Result Value Ref Range Status   Specimen Description BLOOD BLOOD RIGHT FOREARM  Final   Special Requests   Final    BOTTLES DRAWN AEROBIC AND ANAEROBIC Blood Culture adequate volume   Culture   Final    NO GROWTH 5 DAYS Performed at [facility], [address], [place], [place] [address]    Report Status 2019/08/06 FINAL  Final  Culture, blood (routine x 2)     Status: None   Collection Time: 07/16/2019  8:07 PM   Specimen: BLOOD  Result Value Ref Range Status   Specimen Description BLOOD BLOOD LEFT HAND  Final   Special Requests   Final    BOTTLES DRAWN AEROBIC AND ANAEROBIC  Blood Culture adequate volume   Culture   Final    NO GROWTH 5 DAYS Performed at [facility], [address] [place], [place] [address]    Report Status 07/26/19 FINAL  Final  SARS CORONAVIRUS 2 (TAT 6-24 HRS) Nasopharyngeal Nasopharyngeal Swab     Status: Abnormal   Collection Time: 07/20/2019  8:07 PM   Specimen: Nasopharyngeal Swab  Result Value  Ref Range Status   SARS Coronavirus 2 POSITIVE (A) NEGATIVE Final    Comment: RESULT CALLED TO, READ BACK BY AND VERIFIED WITH: DAVIS B, RN 0719 ON 07/18/2019 BY SAINVILUS S (NOTE) SARS-CoV-2 target nucleic acids are DETECTED. The SARS-CoV-2 RNA is generally detectable in upper and lower respiratory specimens during the acute phase of infection. Positive results are indicative of the presence of SARS-CoV-2 RNA. Clinical correlation with patient history and other diagnostic information is  necessary to determine patient infection status. Positive results do not rule out bacterial infection or co-infection with other viruses.  The expected result is Negative. Fact Sheet for Patients: SugarRoll.be Fact Sheet for Healthcare Providers: https://www.woods-mathews.com/ This test is not yet approved or cleared by the [place] FDA and  has been authorized for detection and/or diagnosis of SARS-CoV-2 by FDA under an Emergency Use Authorization (EUA). This EUA will remain  in effect (meaning this test can be use d) for the duration of the COVID-19 declaration under Section 564(b)(1) of the Act, 21 U.S.C. section 360bbb-3(b)(1), unless the authorization is terminated or revoked sooner. Performed at [facility], [place] [address], [place], [place] [address]   Culture, respiratory (non-expectorated)     Status: None   Collection Time: 07/19/19 12:14 AM   Specimen: Tracheal Aspirate; Respiratory  Result Value Ref Range Status   Specimen Description   Final    TRACHEAL ASPIRATE Performed at [facility], [address] [place], [place] [address]    Special Requests   Final    NONE Performed at [facility], [address] [place], [place] [address]    Gram Stain   Final    RARE WBC PRESENT,BOTH PMN AND MONONUCLEAR NO ORGANISMS SEEN    Culture   Final    NO GROWTH 2 DAYS Performed at [facility], [place] [address] [place], [place] [address]    Report Status 07/21/2019 FINAL  Final  MRSA PCR Screening     Status: None   Collection Time: 07/19/19 12:14 AM   Specimen: Nasal Mucosa; Nasopharyngeal  Result Value Ref Range Status   MRSA by PCR NEGATIVE NEGATIVE Final    Comment:        The GeneXpert MRSA Assay (FDA approved for NASAL specimens only), is one component of a comprehensive MRSA colonization surveillance program. It is not intended to diagnose MRSA infection nor to guide or monitor treatment for MRSA infections. Performed at [facility], [address] [place],  [address]   Respiratory Panel by PCR     Status: None   Collection Time: 07/19/19 12:38 AM   Specimen: Nasopharyngeal Swab; Respiratory  Result Value Ref Range Status   Adenovirus NOT DETECTED NOT DETECTED Final   Coronavirus 229E NOT DETECTED NOT DETECTED Final    Comment: (NOTE) The Coronavirus on the Respiratory Panel, DOES NOT test for the novel  Coronavirus (2019 nCoV)    Coronavirus HKU1 NOT DETECTED NOT DETECTED Final   Coronavirus NL63 NOT DETECTED NOT DETECTED Final   Coronavirus OC43 NOT DETECTED NOT DETECTED Final  Metapneumovirus NOT DETECTED NOT DETECTED Final   Rhinovirus / Enterovirus NOT DETECTED NOT DETECTED Final   Influenza A NOT DETECTED NOT DETECTED Final   Influenza B NOT DETECTED NOT DETECTED Final   Parainfluenza Virus 1 NOT DETECTED NOT DETECTED Final   Parainfluenza Virus 2 NOT DETECTED NOT DETECTED Final   Parainfluenza Virus 3 NOT DETECTED NOT DETECTED Final   Parainfluenza Virus 4 NOT DETECTED NOT DETECTED Final   Respiratory Syncytial Virus NOT DETECTED NOT DETECTED Final   Bordetella pertussis NOT DETECTED NOT DETECTED Final   Chlamydophila pneumoniae NOT DETECTED NOT DETECTED Final   Mycoplasma pneumoniae NOT DETECTED NOT DETECTED Final    Comment: Performed at [facility], [place] [address], [place], [place] [address]             Indwelling Urinary  Catheter continued, requirement due to   Reason to continue Indwelling Urinary Catheter strict Intake/Output monitoring for hemodynamic instability         Ventilator continued, requirement due to severe respiratory failure   Ventilator Sedation RASS 0 to -2      ASSESSMENT AND PLAN SYNOPSIS   Severe ACUTE Hypoxic and Hypercapnic Respiratory Failure from COVID 19 infection -continue Full MV support -continue Bronchodilator Therapy -Wean Fio2 and PEEP as tolerated   ACUTE SYSTOLIC CARDIAC FAILURE- EF 40% -oxygen as needed -Lasix as tolerated  ACUTE KIDNEY INJURY/Renal Failure -follow chem 7 -follow UO -continue Foley Catheter-assess need -Avoid nephrotoxic agents -Recheck creatinine   Severe COVID-19 infection, ARDS and pneumonia/pneumonitis Continue IV steroids  IV remdisivir Continue proning as tolerated due to severe hypoxia   Maintain airborne and contact precautions  As needed bronchodilators (MDI) Vitamin C and zinc Antitussives High risk for death  NEUROLOGY - intubated and sedated - minimal sedation to achieve a RASS goal: -1  SHOCK-SEPSIS/HYPOVOLUMIC/CARDIOGENIC -use vasopressors to keep MAP>65 -follow ABG and LA -follow up cultures -emperic ABX   CARDIAC ICU monitoring  ID -continue IV abx as prescibed -follow up cultures  GI GI PROPHYLAXIS as indicated  NUTRITIONAL STATUS DIET-->TF's as tolerated Constipation protocol as indicated  ENDO - will use ICU hypoglycemic\Hyperglycemia protocol if indicated   ELECTROLYTES -follow labs as needed -replace as needed -pharmacy consultation and following   DVT/GI PRX ordered TRANSFUSIONS AS NEEDED MONITOR FSBS ASSESS the need for LABS as needed   FAMILY DISCUSSION FAMILY TO [place] EXTUBATION PATIENT IS DNR, WE WILL PLAN FOR COMFORT CARE MEASURES   Critical Care Time devoted to patient care services described in this note is 35 minutes.   Overall, patient is  critically ill, prognosis is guarded.  Patient with Multiorgan failure and at high risk for cardiac arrest and death.    Corrin Parker, M.D.  [facility]  Medical Director [place] Director [facility] Cardio-Pulmonary Department

## 2019-08-09 NOTE — Death Summary Note (Signed)
DEATH SUMMARY   Patient Details  Name: Richard Elliott MRN: [id number] DOB: 05/13/1925  Admission/Discharge Information   Admit Date:  2019/08/11  Date of Death: Date of Death: 08/16/2019  Time of Death: Time of Death: 10-24-08  Length of Stay: 5  Referring Physician: Ezequiel Kayser, MD   Reason(s) for Hospitalization  86 yo WM with severe hypoxic resp failure from COVID 19 infection/pneumonia   CC  follow up respiratory failure  SUBJECTIVE Patient remains critically ill Prognosis is guarded Severe sCHF Severe resp failure   BP (!) 113/50   Pulse 63   Temp 99.5 F (37.5 C)   Resp 18   Ht 6' (1.829 m)   Wt 76.6 kg   SpO2 99%   BMI 22.90 kg/m    I/O last 3 completed shifts: In: 3072.6 [I.V.:1719.4; NG/GT:853.1; IV Piggyback:500] Out: 800 [Urine:800] No intake/output data recorded.  SpO2: 99 % FiO2 (%): (S) 40 %   SIGNIFICANT EVENTS 2022/08/10: Pt presented to [facility] ER with worsening weakness and altered mental status dx with COVID-19, atrial fibrillation with rvr, acute metabolic encephalopathy, and NSTEMIvs. demand ischemiarequiring 2L O2 via nasal canula. Pt admitted to telemetry unit but remained in [facility] ER pending bed availability  08-10-22: CT Head revealed no acute intracranial process 01/10: Cardiologyconsultedrecommended continuing heparin gttalthough felt symptoms not consistent with acute coronary syndrome, pt deemed not a candidate for invasive cardiac catheterization 01/10: Pt developed worsening respiratory failuresuspected secondary to aspiration following coughing episode with bloody sputum production followed by an episode of emesisrequiringmechanical intubation. PCCM contacted by hospitalist team to assume care 1/11self extubated, reintubated, severe hypoxia 1/12 family updated 1/13 family has agreed for one way extubation  Diagnoses  Preliminary cause of death:   COVID 30 INFECTION COVID 48 PNEUMONIA ISCHEMIC  CARDIOMYOPATHY  Secondary Diagnoses (including complications and co-morbidities):  Principal Problem:   NSTEMI (non-ST elevated myocardial infarction) ([place]) Active Problems:   Atrial flutter with rapid ventricular response (HCC)   CAD (coronary artery disease)   S/P TAVR (transcatheter aortic valve replacement)   Acute metabolic encephalopathy   Generalized weakness   Atrial flutter ([place])   Acute respiratory failure with hypoxia [facility])   Brief Hospital Course (including significant findings, care, treatment, and services provided and events leading to death)   Severe ACUTE Hypoxic and Hypercapnic Respiratory Failure from COVID 19 infection -continue Full MV support -continue Bronchodilator Therapy -Wean Fio2 and PEEP as tolerated   ACUTE SYSTOLIC CARDIAC FAILURE- EF 40% -oxygen as needed -Lasix as tolerated  ACUTE KIDNEY INJURY/Renal Failure -follow chem 7 -follow UO -continue Foley Catheter-assess need -Avoid nephrotoxic agents -Recheck creatinine   Severe COVID-19 infection, ARDS and pneumonia/pneumonitis Continue IV steroids  IV remdisivir Continue proning as tolerated due to severe hypoxia   Maintain airborne and contact precautions  As needed bronchodilators (MDI) Vitamin C and zinc Antitussives High risk for death  NEUROLOGY - intubated and sedated - minimal sedation to achieve a RASS goal: -1  SHOCK-SEPSIS/HYPOVOLUMIC/CARDIOGENIC -use vasopressors to keep MAP>65 -follow ABG and LA -follow up cultures -emperic ABX   CARDIAC ICU monitoring  ID -continue IV abx as prescibed -follow up cultures  GI GI PROPHYLAXIS as indicated  NUTRITIONAL STATUS DIET-->TF's as tolerated Constipation protocol as indicated  ENDO - will use ICU hypoglycemic\Hyperglycemia protocol if indicated   ELECTROLYTES -follow labs as needed -replace as needed -pharmacy consultation and following   DVT/GI PRX ordered TRANSFUSIONS AS NEEDED MONITOR  FSBS ASSESS the need for LABS as needed   FAMILY DISCUSSION FAMILY  TO ARRIVE TODAY FOR ONE WAY EXTUBATION PATIENT IS DNR, WE WILL PLAN FOR COMFORT CARE MEASURES    Family At bedside, clinical status relayed to family  Updated and notified of patients medical condition-  Progressive multiorgan failure with very low chance of meaningful recovery.  Patient is in dying  Process.  Family understands the situation.  They would like to proceed with Comfort care measures.   Family are satisfied with Plan of action and management. All questions answered       Pertinent Labs and Studies  Significant Diagnostic Studies DG Chest 1 View  Result Date: 07/18/2019 CLINICAL DATA:  Shortness of breath EXAM: CHEST  1 VIEW COMPARISON:  [place] FINDINGS: There has been interval development of multifocal airspace opacities. There is no pneumothorax. No large pleural effusion. The patient is status post prior median sternotomy. The patient is status post prior TAVR. Aortic calcifications are noted. There are advanced degenerative changes of both glenohumeral joints. IMPRESSION: Short interval development of multifocal airspace opacities. Differential considerations include multifocal pneumonia (viral or bacterial) or developing pulmonary edema. Electronically Signed   By: Constance Holster M.D.   On: 07/18/2019 20:57   DG Abdomen 1 View  Result Date: 07/18/2019 CLINICAL DATA:  NG tube placement EXAM: ABDOMEN - 1 VIEW COMPARISON:  11/11/2008 FINDINGS: NG tube is in the stomach. Mild gaseous distension of the right colon and transverse colon. No visible free air. IMPRESSION: NG tube tip in the stomach. Electronically Signed   By: Rolm Baptise M.D.   On: 07/18/2019 22:22   CT HEAD WO CONTRAST  Result Date: 07/26/2019 CLINICAL DATA:  Weakness and altered mental status EXAM: CT HEAD WITHOUT CONTRAST TECHNIQUE: Contiguous axial images were obtained from the base of the skull through the vertex without  intravenous contrast. COMPARISON:  CT head dated 03/27/2016. FINDINGS: Brain: No evidence of acute infarction, hemorrhage, hydrocephalus, extra-axial collection or mass lesion/mass effect. Periventricular white matter hypoattenuation likely represents chronic small vessel ischemic disease. Vascular: There are vascular calcifications in the carotid siphons. Skull: Normal. Negative for fracture or focal lesion. Sinuses/Orbits: The patient is status post a left mastoidectomy. Other: None. IMPRESSION: 1. No acute intracranial process. Electronically Signed   By: Zerita Boers M.D.   On: 07/19/2019 18:48   MR BRAIN WO CONTRAST  Result Date: 06/25/2019 CLINICAL DATA:  Dizziness. Additional history provided: Dizziness for 1 week. EXAM: MRI HEAD WITHOUT CONTRAST TECHNIQUE: Multiplanar, multiecho pulse sequences of the brain and surrounding structures were obtained without intravenous contrast. COMPARISON:  Head CT 03/27/2016 FINDINGS: Brain: 3 mm focus of cortical restricted diffusion within the right superior frontal gyrus (series 5, image 33) consistent with acute/early subacute infarct. No evidence of intracranial mass. No midline shift or extra-axial fluid collection. No chronic intracranial blood products. Chronic lacunar infarcts within the left corona radiata/superior basal ganglia, as well as left caudate head. Chronic lacunar infarcts also present within the bilateral cerebellar hemispheres. Background of otherwise mild chronic small vessel ischemic disease. Mild generalized parenchymal atrophy. Vascular: Flow voids maintained within the proximal large arterial vessels. Skull and upper cervical spine: No focal marrow lesion. Ligamentous hypertrophy at the craniocervical junction and along the posterior aspect of the dens with resultant mild-to-moderate narrowing of the craniocervical junction and spinal canal at the C1-C2 level. No mass effect upon the caudal medulla or upper cervical spinal cord.  Sinuses/Orbits: Bilateral lens replacements. Minimal ethmoid sinus mucosal thickening. Left mastoid effusion. Impression #1 below will be called to the ordering clinician or representative by  the Radiologist Assistant, and communication documented in the PACS or zVision Dashboard. IMPRESSION: 3 mm acute/early subacute left frontal lobe cortical infarct. Chronic supratentorial and infratentorial lacunar infarcts as described. Background of otherwise mild chronic small vessel ischemic disease. Prominent ligamentous hypertrophy at the craniocervical junction and along the posterior aspect of the dens. Resultant mild-to-moderate narrowing of the craniocervical junction and spinal canal at the C1-C2 level. No mass effect upon the caudal medulla or upper cervical spinal cord. Left mastoid effusion. Electronically Signed   By: Kellie Simmering DO   On: 06/25/2019 09:13   DG Chest Port 1 View  Result Date: 07/19/2019 CLINICAL DATA:  Endotracheal tube intubation EXAM: PORTABLE CHEST 1 VIEW COMPARISON:  07/18/2019 FINDINGS: Endotracheal tube in good position.  NG tube in the stomach. Bilateral airspace disease unchanged.  No pneumothorax or effusion. IMPRESSION: Endotracheal tube in good position. Bilateral airspace disease unchanged. Electronically Signed   By: Franchot Gallo M.D.   On: 07/19/2019 16:07   DG Chest Portable 1 View  Result Date: 07/18/2019 CLINICAL DATA:  Intubation EXAM: PORTABLE CHEST 1 VIEW COMPARISON:  07/18/2019 FINDINGS: Endotracheal tube is 4 cm above the carina. NG tube is in the stomach. Patchy bilateral airspace disease again noted, unchanged. Prior CABG and aortic valve repair. Heart is normal size. No effusions. No acute bony abnormality. IMPRESSION: Endotracheal tube 4 cm above the carina.  NG tube in the stomach. Stable patchy bilateral airspace disease. Electronically Signed   By: Rolm Baptise M.D.   On: 07/18/2019 22:23   DG Chest Port 1 View  Result Date: 07/25/2019 CLINICAL DATA:   Altered mental status EXAM: PORTABLE CHEST 1 VIEW COMPARISON:  Chest radiograph dated 03/29/2016 FINDINGS: The heart is normal in size. Median sternotomy wires are redemonstrated. The lungs are hypoinflated. There is mild bibasilar atelectasis/airspace disease. There is no pleural effusion or pneumothorax. Degenerative changes are seen in both shoulders. IMPRESSION: 1. Low lung volumes with mild bibasilar atelectasis/airspace disease. Electronically Signed   By: Zerita Boers M.D.   On: 08/07/2019 19:16   ECHOCARDIOGRAM COMPLETE  Result Date: 07/19/2019   ECHOCARDIOGRAM REPORT   Patient Name:   Richard Elliott Date of Exam: 07/19/2019 Medical Rec #:  [id number]      Height:       72.0 in Accession #:    [id number]     Weight:       169.5 lb Date of Birth:  05/05/1925     BSA:          1.99 m Patient Age:    84 years       BP:           103/59 mmHg Patient Gender: M              HR:           104 bpm. Exam Location:  [facility] Procedure: 2D Echo, Color Doppler and Cardiac Doppler Indications:     I21.4 NSTEMI  History:         Patient has prior history of Echocardiogram examinations.                  HFpEF, Previous Myocardial Infarction and CAD, Prior CABG; Risk                  Factors:Dyslipidemia. Aortic Valve: A CoreValve-EvolutR                  bioprosthetic, stented aortic valve (TAVR) TAVR; Pt tested  positive for novel coronavirus.  Sonographer:     Charmayne Sheer RDCS (AE) Referring Phys:  [id number] [facility] AMERY Diagnosing Phys: Isaias Cowman MD  Sonographer Comments: Echo performed with patient supine and on artificial respirator, suboptimal parasternal window and no subcostal window. IMPRESSIONS  1. Left ventricular ejection fraction, by visual estimation, is 40 to 45%. The left ventricle has normal function. There is no left ventricular hypertrophy.  2. The left ventricle demonstrates regional wall motion abnormalities.  3. Global right ventricle has normal systolic function.The right  ventricular size is normal. No increase in right ventricular wall thickness.  4. Left atrial size was normal.  5. Right atrial size was normal.  6. The mitral valve is normal in structure. Moderate mitral valve regurgitation. No evidence of mitral stenosis.  7. The tricuspid valve is normal in structure.  8. The aortic valve is abnormal. Aortic valve regurgitation is not visualized. No evidence of aortic valve sclerosis or stenosis.  9. The pulmonic valve was normal in structure. Pulmonic valve regurgitation is not visualized. 10. The inferior vena cava is normal in size with greater than 50% respiratory variability, suggesting right atrial pressure of 3 mmHg. FINDINGS  Left Ventricle: Left ventricular ejection fraction, by visual estimation, is 40 to 45%. The left ventricle has normal function. The left ventricle demonstrates regional wall motion abnormalities. There is no left ventricular hypertrophy. Normal left atrial pressure. Right Ventricle: The right ventricular size is normal. No increase in right ventricular wall thickness. Global RV systolic function is has normal systolic function. Left Atrium: Left atrial size was normal in size. Right Atrium: Right atrial size was normal in size Pericardium: There is no evidence of pericardial effusion. Mitral Valve: The mitral valve is normal in structure. Moderate mitral valve regurgitation. No evidence of mitral valve stenosis by observation. MV peak gradient, 9.6 mmHg. Tricuspid Valve: The tricuspid valve is normal in structure. Tricuspid valve regurgitation mild-moderate. Aortic Valve: The aortic valve is abnormal. Aortic valve regurgitation is not visualized. The aortic valve is structurally normal, with no evidence of sclerosis or stenosis. Aortic valve mean gradient measures 3.0 mmHg. Aortic valve peak gradient measures 4.9 mmHg. Aortic valve area, by VTI measures 1.90 cm. CoreValve-EvolutR bioprosthetic, stented aortic valve (TAVR) valve is present in the  aortic position. Pulmonic Valve: The pulmonic valve was normal in structure. Pulmonic valve regurgitation is not visualized. Pulmonic regurgitation is not visualized. Aorta: The aortic root, ascending aorta and aortic arch are all structurally normal, with no evidence of dilitation or obstruction. Venous: The inferior vena cava is normal in size with greater than 50% respiratory variability, suggesting right atrial pressure of 3 mmHg. IAS/Shunts: No atrial level shunt detected by color flow Doppler. There is no evidence of a patent foramen ovale. No ventricular septal defect is seen or detected. There is no evidence of an atrial septal defect.  LEFT VENTRICLE PLAX 2D LVIDd:         3.61 cm  Diastology LVIDs:         2.79 cm  LV e' lateral:   5.33 cm/s LV PW:         1.06 cm  LV E/e' lateral: 19.0 LV IVS:        0.93 cm  LV e' medial:    4.03 cm/s LVOT diam:     1.80 cm  LV E/e' medial:  25.1 LV SV:         26 ml LV SV Index:   12.90 LVOT Area:  2.54 cm  LEFT ATRIUM             Index       RIGHT ATRIUM          Index LA diam:        3.00 cm 1.51 cm/m  RA Area:     6.54 cm LA Vol (A2C):   19.2 ml 9.67 ml/m  RA Volume:   9.08 ml  4.57 ml/m LA Vol (A4C):   26.2 ml 13.19 ml/m LA Biplane Vol: 24.2 ml 12.18 ml/m  AORTIC VALVE                   PULMONIC VALVE AV Area (Vmax):    2.10 cm    PV Vmax:       1.26 m/s AV Area (Vmean):   1.83 cm    PV Vmean:      85.200 cm/s AV Area (VTI):     1.90 cm    PV VTI:        0.152 m AV Vmax:           111.00 cm/s PV Peak grad:  6.4 mmHg AV Vmean:          76.500 cm/s PV Mean grad:  3.0 mmHg AV VTI:            0.161 m AV Peak Grad:      4.9 mmHg AV Mean Grad:      3.0 mmHg LVOT Vmax:         91.40 cm/s LVOT Vmean:        55.000 cm/s LVOT VTI:          0.120 m LVOT/AV VTI ratio: 0.75  AORTA Ao Root diam: 2.70 cm MITRAL VALVE MV Area (PHT): 6.52 cm              SHUNTS MV Peak grad:  9.6 mmHg              Systemic VTI:  0.12 m MV Mean grad:  4.0 mmHg              Systemic  Diam: 1.80 cm MV Vmax:       1.55 m/s MV Vmean:      91.8 cm/s MV VTI:        0.18 m MV PHT:        33.74 msec MV Decel Time: 116 msec MV E velocity: 101.03 cm/s 103 cm/s MV A velocity: 162.33 cm/s 70.3 cm/s MV E/A ratio:  0.62        1.5  Isaias Cowman MD Electronically signed by Isaias Cowman MD Signature Date/Time: 07/19/2019/12:53:11 PM    Final     Microbiology Recent Results (from the past 240 hour(s))  Urine culture     Status: Abnormal   Collection Time: 07/14/2019  5:55 PM   Specimen: Urine, Clean Catch  Result Value Ref Range Status   Specimen Description   Final    URINE, CLEAN CATCH Performed at [facility], [address], [place], [place] [address]    Special Requests   Final    NONE Performed at [facility], [address] [place],  [address]    Culture MULTIPLE SPECIES PRESENT, SUGGEST RECOLLECTION (A)  Final   Report Status 07/19/2019 FINAL  Final  Culture, blood (routine x 2)     Status: None   Collection Time: 07/11/2019  5:55 PM   Specimen: BLOOD  Result Value Ref Range Status  Specimen Description BLOOD BLOOD RIGHT FOREARM  Final   Special Requests   Final    BOTTLES DRAWN AEROBIC AND ANAEROBIC Blood Culture adequate volume   Culture   Final    NO GROWTH 5 DAYS Performed at [facility], [address] [place], [place] [address]    Report Status 07/24/2019 FINAL  Final  Culture, blood (routine x 2)     Status: None   Collection Time: 07/18/2019  8:07 PM   Specimen: BLOOD  Result Value Ref Range Status   Specimen Description BLOOD BLOOD LEFT HAND  Final   Special Requests   Final    BOTTLES DRAWN AEROBIC AND ANAEROBIC Blood Culture adequate volume   Culture   Final    NO GROWTH 5 DAYS Performed at [facility], [address], [place], [place] [address]    Report Status July 24, 2019 FINAL  Final  SARS CORONAVIRUS 2 (TAT 6-24 HRS) Nasopharyngeal Nasopharyngeal Swab     Status: Abnormal   Collection  Time: 07/25/2019  8:07 PM   Specimen: Nasopharyngeal Swab  Result Value Ref Range Status   SARS Coronavirus 2 POSITIVE (A) NEGATIVE Final    Comment: RESULT CALLED TO, READ BACK BY AND VERIFIED WITH: DAVIS B, RN 0719 ON 07/18/2019 BY SAINVILUS S (NOTE) SARS-CoV-2 target nucleic acids are DETECTED. The SARS-CoV-2 RNA is generally detectable in upper and lower respiratory specimens during the acute phase of infection. Positive results are indicative of the presence of SARS-CoV-2 RNA. Clinical correlation with patient history and other diagnostic information is  necessary to determine patient infection status. Positive results do not rule out bacterial infection or co-infection with other viruses.  The expected result is Negative. Fact Sheet for Patients: SugarRoll.be Fact Sheet for Healthcare Providers: https://www.woods-mathews.com/ This test is not yet approved or cleared by the [place] FDA and  has been authorized for detection and/or diagnosis of SARS-CoV-2 by FDA under an Emergency Use Authorization (EUA). This EUA will remain  in effect (meaning this test can be use d) for the duration of the COVID-19 declaration under Section 564(b)(1) of the Act, 21 U.S.C. section 360bbb-3(b)(1), unless the authorization is terminated or revoked sooner. Performed at [facility], [place] [address], [place], [place] [address]   Culture, respiratory (non-expectorated)     Status: None   Collection Time: 07/19/19 12:14 AM   Specimen: Tracheal Aspirate; Respiratory  Result Value Ref Range Status   Specimen Description   Final    TRACHEAL ASPIRATE Performed at [facility], [address] [place], [place] [address]    Special Requests   Final    NONE Performed at [facility], [address] [place], [place] [address]    Gram Stain   Final    RARE WBC PRESENT,BOTH PMN AND MONONUCLEAR NO ORGANISMS SEEN    Culture   Final     NO GROWTH 2 DAYS Performed at [facility], [place] [address], [place], [place] [address]    Report Status 07/21/2019 FINAL  Final  MRSA PCR Screening     Status: None   Collection Time: 07/19/19 12:14 AM   Specimen: Nasal Mucosa; Nasopharyngeal  Result Value Ref Range Status   MRSA by PCR NEGATIVE NEGATIVE Final    Comment:        The GeneXpert MRSA Assay (FDA approved for NASAL specimens only), is one component of a comprehensive MRSA colonization surveillance program. It is not intended to diagnose MRSA infection nor to  guide or monitor treatment for MRSA infections. Performed at [facility], [address] [place], [place] [address]   Respiratory Panel by PCR     Status: None   Collection Time: 07/19/19 12:38 AM   Specimen: Nasopharyngeal Swab; Respiratory  Result Value Ref Range Status   Adenovirus NOT DETECTED NOT DETECTED Final   Coronavirus 229E NOT DETECTED NOT DETECTED Final    Comment: (NOTE) The Coronavirus on the Respiratory Panel, DOES NOT test for the novel  Coronavirus (2019 nCoV)    Coronavirus HKU1 NOT DETECTED NOT DETECTED Final   Coronavirus NL63 NOT DETECTED NOT DETECTED Final   Coronavirus OC43 NOT DETECTED NOT DETECTED Final   Metapneumovirus NOT DETECTED NOT DETECTED Final   Rhinovirus / Enterovirus NOT DETECTED NOT DETECTED Final   Influenza A NOT DETECTED NOT DETECTED Final   Influenza B NOT DETECTED NOT DETECTED Final   Parainfluenza Virus 1 NOT DETECTED NOT DETECTED Final   Parainfluenza Virus 2 NOT DETECTED NOT DETECTED Final   Parainfluenza Virus 3 NOT DETECTED NOT DETECTED Final   Parainfluenza Virus 4 NOT DETECTED NOT DETECTED Final   Respiratory Syncytial Virus NOT DETECTED NOT DETECTED Final   Bordetella pertussis NOT DETECTED NOT DETECTED Final   Chlamydophila pneumoniae NOT DETECTED NOT DETECTED Final   Mycoplasma pneumoniae NOT DETECTED NOT DETECTED Final    Comment: Performed at [facility], [place]. [address], [place], [place] [address]    Lab Basic Metabolic Panel: Recent Labs  Lab 07/18/19 [phone] 07/18/19 2350 07/19/19 0333 07/20/19 0539 07/21/19 0436 2019-08-19 0559  NA 138 137  --  141 139 143  K 3.6 4.1  --  4.6 4.7 4.7  CL 105 105  --  109 107 111  CO2 20* 21*  --  23 24 21*  GLUCOSE 110* 182*  --  142* 237* 251*  BUN 23 32*  --  41* 57* 71*  CREATININE 1.35* 1.82*  --  1.38* 1.43* 1.48*  CALCIUM 8.8* 8.3*  --  8.6* 8.5* 8.6*  MG  --   --  1.7 2.2 2.3 2.5*  PHOS  --   --  3.9 4.1 3.4 3.9   Liver Function Tests: Recent Labs  Lab 07/12/2019 1755 07/18/19 2350 07/20/19 0539 07/21/19 0436 08/19/2019 0559  AST 47* 57* 95* 86* 51*  ALT 21 21 45* 55* 50*  ALKPHOS 43 34* 33* 34* 45  BILITOT 1.3* 1.0 0.8 0.6 0.8  PROT 7.9 6.4* 6.1* 5.9* 6.2*  ALBUMIN 4.1 2.8* 2.5* 2.6* 2.6*   No results for input(s): LIPASE, AMYLASE in the last 168 hours. No results for input(s): AMMONIA in the last 168 hours. CBC: Recent Labs  Lab 08/01/2019 1755 07/24/2019 1755 07/18/19 [id number] 07/19/19 0333 07/20/19 0539 07/21/19 0436 2019-08-19 0559  WBC 6.6   < > 6.6 12.2* 18.7* 14.2* 22.2*  NEUTROABS 5.0  --   --  11.5* 17.2* 12.7* 18.6*  HGB 11.9*   < > 10.8* 10.9* 10.5* 10.1* 11.0*  HCT 36.3*   < > 32.5* 33.5* 32.7* 32.3* 34.9*  MCV 96.5   < > 96.4 97.7 97.6 101.6* 100.6*  PLT 202   < > 169 196 296 270 391   < > = values in this interval not displayed.   Cardiac Enzymes: No results for input(s): CKTOTAL, CKMB, CKMBINDEX, TROPONINI in the last 168 hours. Sepsis Labs: Recent Labs  Lab 07/18/19 [id number] 07/18/19 2350 07/19/19 0333 07/20/19 0539 07/21/19 0436 08-19-19 0559  PROCALCITON  --  3.34  --   --   --   --   WBC   < >  --  12.2* 18.7* 14.2* 22.2*  LATICACIDVEN  --  3.1* 2.7*  --   --   --    < > = values in this interval not displayed.      Flora Lipps 08/11/2019, 5:33 PM

## 2019-08-09 NOTE — Progress Notes (Signed)
0720 Dr. Mortimer Fries notified of increase in patients heart rate and drop in his oxygen saturation. No orders received.

## 2019-08-09 NOTE — Progress Notes (Signed)
Pt was suctioned prior to extubation for a small amount of frothy pink secretions. Per Dr. Zoila Shutter order, he was extubated without incident. He is comfort care.

## 2019-08-09 NOTE — Progress Notes (Signed)
Inpatient Diabetes Program Recommendations  AACE/ADA: New Consensus Statement on Inpatient Glycemic Control (2015)  Target Ranges:  Prepandial:   less than 140 mg/dL      Peak postprandial:   less than 180 mg/dL (1-2 hours)      Critically ill patients:  140 - 180 mg/dL   Lab Results  Component Value Date   GLUCAP 255 (H) 08-14-2019   HGBA1C 6.3 (H) 07/18/2019    Review of Glycemic Control  Results for Richard Elliott, Richard Elliott (MRN [id number]) as of 08/14/2019 12:45  Ref. Range 07/21/2019 23:57 08-14-19 03:31 08/14/19 05:59 August 14, 2019 08:46 2019-08-14 11:19  Glucose-Capillary Latest Ref Range: 70 - 99 mg/dL 210 (H) 238 (H)  270 (H) 255 (H)    Diabetes history: NONE Outpatient Diabetes medications: NONE Current orders for Inpatient glycemic control: Novolog 0-9 Q4h + Decadron 6 mg QD  Inpatient Diabetes Program Recommendations:     -Please consider Levemir 5 units BID  Thank you, Reche Dixon, RN, BSN Diabetes Coordinator Inpatient Diabetes Program [phone] (team pager from 8a-5p)

## 2019-08-09 NOTE — Progress Notes (Signed)
Pt. is 53 YOM in ICU w/COVID and recent stroke.  Pt. intubated and sedated.  CH met w/pt.'s eldest son Abe People) and pt.'s grdson Gwyndolyn Saxon) in follow-up from phone conversation w/youngest son Elenore Rota 'Donny') ystrdy re: decision to put pt. on comfort care.  Family waiting in lobby for ICU visitors clearance when [facility] arrived; both appeared to be coping effectively.  Son conveyed family agreement Abe People, [place], and dtr. Opal Sidles) re: comfort care for pt.  Son shared that pt.'s wife died 61yr ago, and pt.'s brother (114yrs younger) died less than a month ago; both of these strongly impacted pt.'s will to live, son shared.  Pt. is a former aClinical research associate  [facility] family and RN aware of on-call CH's availability if needed.  No further needs expressed at this time.    [phone]  Clinical Encounter Type  Visited With Family  Visit Type Follow-up;Psychological support;Social support;Patient actively dying;Critical Care;Spiritual support  Referral From Chaplain;Nurse  Consult/Referral To Chaplain  Recommendations  (Pt. being placed on comfort care; RN/family will call if nee)  Spiritual Encounters  Spiritual Needs Emotional;Grief support  Stress Factors  Patient Stress Factors Loss;Exhausted;Major life changes  Family Stress Factors Health changes;Loss

## 2019-08-09 NOTE — Progress Notes (Signed)
Family At bedside, clinical status relayed to family  Updated and notified of patients medical condition-  Progressive multiorgan failure with very low chance of meaningful recovery.  Patient is in dying  Process.  Family understands the situation.  They have consented and agreed to DNR/DNI and would like to proceed with Comfort care measures.   Family are satisfied with Plan of action and management. All questions answered  Alajiah Dutkiewicz David Edie Darley, M.D.  [facility]  Medical Director ICU-[facility] [place] Medical Director [facility] Cardio-Pulmonary Department     

## 2019-08-09 NOTE — Progress Notes (Signed)
All vent supplies changed for the second time due to copious amounts of frothy pink red secretions pouring from et tube. o2 increased early due to increased heart rate and low sats

## 2019-08-09 NOTE — Progress Notes (Signed)
Nutrition Brief Follow-Up Note  Chart reviewed. Patient now transitioning to comfort care.   No further nutrition interventions warranted at this time. Please re-consult RD as needed.   Ayshia Gramlich King, MS, RD, LDN Office: [phone] Pager: [phone] After Hours/Weekend Pager: [phone] 

## 2019-08-09 NOTE — Progress Notes (Addendum)
After verifying with Drucie Ip that son and grandson are the 2 visitors for m,Mr. Pilands extubation and final visitors . Son and grandson ,talked to Dr. Mortimer Fries. CCMD and Elink n otified and video visit set up patient was extubated at 70. Family in via video per E-Link. Patient died at 23.

## 2019-08-09 DEATH — deceased

## 2021-04-07 IMAGING — DX DG CHEST 1V PORT
1 series · 1 of 1 positions shown · non-contrast
Comparison: 07/18/2019

CLINICAL DATA: Endotracheal tube intubation

EXAM:
PORTABLE CHEST 1 VIEW

[chest ap]
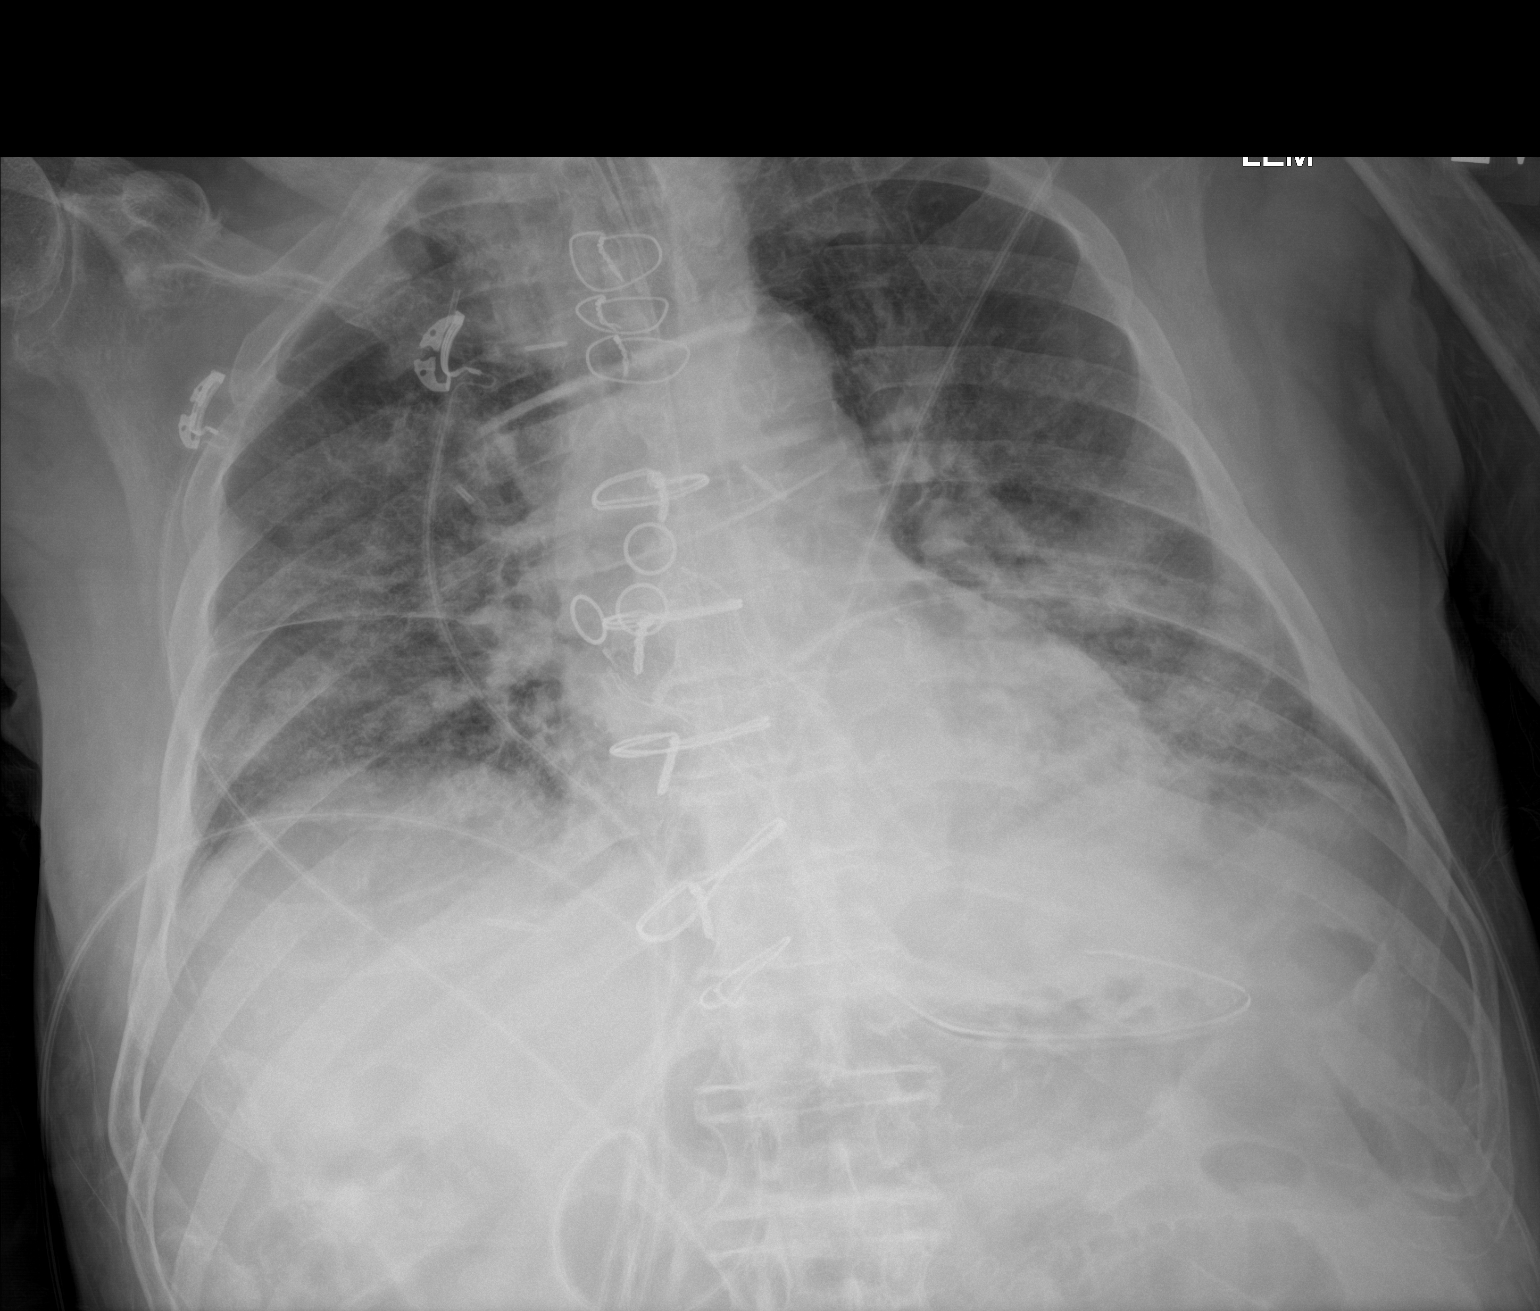

[1 of 1 positions shown; findings below may reference images not displayed]

FINDINGS: Endotracheal tube in good position.  NG tube in the stomach.

Bilateral airspace disease unchanged.  No pneumothorax or effusion.
IMPRESSION: Endotracheal tube in good position.

Bilateral airspace disease unchanged.
